# Patient Record
Sex: Female | Born: 1940 | Race: White | Hispanic: No | Marital: Married | Smoking: Former smoker
Health system: Southern US, Community
[De-identification: ages and names within clinical notes are randomized; demographics above are authoritative.]

## PROBLEM LIST (undated history)

## (undated) DIAGNOSIS — I7300 Raynaud's syndrome without gangrene: Secondary | ICD-10-CM

## (undated) DIAGNOSIS — F419 Anxiety disorder, unspecified: Secondary | ICD-10-CM

## (undated) DIAGNOSIS — M1990 Unspecified osteoarthritis, unspecified site: Secondary | ICD-10-CM

## (undated) DIAGNOSIS — R519 Headache, unspecified: Secondary | ICD-10-CM

## (undated) DIAGNOSIS — R51 Headache: Secondary | ICD-10-CM

## (undated) HISTORY — PX: FOOT SURGERY: SHX648

## (undated) HISTORY — PX: CHOLECYSTECTOMY: SHX55

## (undated) HISTORY — PX: APPENDECTOMY: SHX54

---

## 1997-07-21 ENCOUNTER — Ambulatory Visit (HOSPITAL_COMMUNITY): Admission: RE | Admit: 1997-07-21 | Discharge: 1997-07-21 | Payer: Self-pay | Admitting: Obstetrics and Gynecology

## 1998-05-22 ENCOUNTER — Other Ambulatory Visit: Admission: RE | Admit: 1998-05-22 | Discharge: 1998-05-22 | Payer: Self-pay | Admitting: Obstetrics and Gynecology

## 1999-06-21 ENCOUNTER — Other Ambulatory Visit: Admission: RE | Admit: 1999-06-21 | Discharge: 1999-06-21 | Payer: Self-pay | Admitting: Obstetrics and Gynecology

## 2003-10-06 ENCOUNTER — Other Ambulatory Visit: Admission: RE | Admit: 2003-10-06 | Discharge: 2003-10-06 | Payer: Self-pay | Admitting: Obstetrics and Gynecology

## 2004-10-10 ENCOUNTER — Other Ambulatory Visit: Admission: RE | Admit: 2004-10-10 | Discharge: 2004-10-10 | Payer: Self-pay | Admitting: Obstetrics and Gynecology

## 2004-12-09 ENCOUNTER — Emergency Department (HOSPITAL_COMMUNITY): Admission: EM | Admit: 2004-12-09 | Discharge: 2004-12-09 | Payer: Self-pay | Admitting: Family Medicine

## 2005-10-13 ENCOUNTER — Other Ambulatory Visit: Admission: RE | Admit: 2005-10-13 | Discharge: 2005-10-13 | Payer: Self-pay | Admitting: Obstetrics and Gynecology

## 2006-02-28 ENCOUNTER — Emergency Department (HOSPITAL_COMMUNITY): Admission: EM | Admit: 2006-02-28 | Discharge: 2006-02-28 | Payer: Self-pay | Admitting: Family Medicine
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
## 2007-07-12 ENCOUNTER — Other Ambulatory Visit: Admission: RE | Admit: 2007-07-12 | Discharge: 2007-07-12 | Payer: Self-pay | Admitting: Obstetrics and Gynecology

## 2009-07-24 ENCOUNTER — Other Ambulatory Visit: Admission: RE | Admit: 2009-07-24 | Discharge: 2009-07-24 | Payer: Self-pay | Admitting: Obstetrics and Gynecology

## 2012-12-23 ENCOUNTER — Other Ambulatory Visit: Payer: Self-pay | Admitting: Dermatology

## 2013-09-06 ENCOUNTER — Other Ambulatory Visit: Payer: Self-pay | Admitting: Dermatology

## 2014-01-13 ENCOUNTER — Other Ambulatory Visit: Payer: Self-pay | Admitting: Dermatology

## 2014-11-14 ENCOUNTER — Other Ambulatory Visit (HOSPITAL_COMMUNITY)
Admission: RE | Admit: 2014-11-14 | Discharge: 2014-11-14 | Disposition: A | Discharge status: Home / Self Care | Payer: Medicare Other | Source: Ambulatory Visit | Attending: Nurse Practitioner | Admitting: Nurse Practitioner

## 2014-11-14 ENCOUNTER — Other Ambulatory Visit: Payer: Self-pay | Admitting: Nurse Practitioner

## 2014-11-14 DIAGNOSIS — Z1151 Encounter for screening for human papillomavirus (HPV): Secondary | ICD-10-CM | POA: Insufficient documentation

## 2014-11-14 DIAGNOSIS — Z124 Encounter for screening for malignant neoplasm of cervix: Secondary | ICD-10-CM | POA: Insufficient documentation

## 2014-11-17 LAB — CYTOLOGY - PAP

## 2015-01-08 ENCOUNTER — Other Ambulatory Visit: Payer: Self-pay | Admitting: Obstetrics and Gynecology

## 2016-07-03 ENCOUNTER — Ambulatory Visit: Payer: Self-pay | Admitting: Orthopedic Surgery

## 2016-07-15 NOTE — Patient Instructions (Addendum)
Jodi Lopez  07/15/2016   Your procedure is scheduled on:  07/21/16  Report to [facility] Main  Entrance              Report to admitting at     0725   AM   Call this number if you have problems the morning of surgery [phone]   Remember: ONLY 1 PERSON MAY GO WITH YOU TO SHORT STAY TO GET  READY MORNING OF YOUR SURGERY.  Do not eat food or drink liquids :After Midnight.     Take these medicines the morning of surgery with A SIP OF WATER: NONE   Eye drops if needed                                You may not have any metal on your body including hair pins and              piercings  Do not wear jewelry, make-up, lotions, powders or perfumes, deodorant             Do not wear nail polish.  Do not shave  48 hours prior to surgery.             Do not bring valuables to the hospital. [facility] IS NOT             RESPONSIBLE   FOR VALUABLES.  Contacts, dentures or bridgework may not be worn into surgery.  Leave suitcase in the car. After surgery it may be brought to your room.                 Please read over the following fact sheets you were given: _____________________________________________________________________             [facility] - Preparing for Surgery Before surgery, you can play an important role.  Because skin is not sterile, your skin needs to be as free of germs as possible.  You can reduce the number of germs on your skin by washing with CHG (chlorahexidine gluconate) soap before surgery.  CHG is an antiseptic cleaner which kills germs and bonds with the skin to continue killing germs even after washing. Please DO NOT use if you have an allergy to CHG or antibacterial soaps.  If your skin becomes reddened/irritated stop using the CHG and inform your nurse when you arrive at Short Stay. Do not shave (including legs and underarms) for at least 48 hours prior to the first CHG shower.  You may shave your face/neck. Please follow  these instructions carefully:  1.  Shower with CHG Soap the night before surgery and the  morning of Surgery.  2.  If you choose to wash your hair, wash your hair first as usual with your  normal  shampoo.  3.  After you shampoo, rinse your hair and body thoroughly to remove the  shampoo.                           4.  Use CHG as you would any other liquid soap.  You can apply chg directly  to the skin and wash                       Gently with a scrungie or  clean washcloth.  5.  Apply the CHG Soap to your body ONLY FROM THE NECK DOWN.   Do not use on face/ open                           Wound or open sores. Avoid contact with eyes, ears mouth and genitals (private parts).                       Wash face,  Genitals (private parts) with your normal soap.             6.  Wash thoroughly, paying special attention to the area where your surgery  will be performed.  7.  Thoroughly rinse your body with warm water from the neck down.  8.  DO NOT shower/wash with your normal soap after using and rinsing off  the CHG Soap.                9.  Pat yourself dry with a clean towel.            10.  Wear clean pajamas.            11.  Place clean sheets on your bed the night of your first shower and do not  sleep with pets. Day of Surgery : Do not apply any lotions/deodorants the morning of surgery.  Please wear clean clothes to the hospital/surgery center.  FAILURE TO FOLLOW THESE INSTRUCTIONS MAY RESULT IN THE CANCELLATION OF YOUR SURGERY PATIENT SIGNATURE_________________________________  NURSE SIGNATURE__________________________________  ________________________________________________________________________  WHAT IS A BLOOD TRANSFUSION? Blood Transfusion Information  A transfusion is the replacement of blood or some of its parts. Blood is made up of multiple cells which provide different functions.  Red blood cells carry oxygen and are used for blood loss replacement.  White blood cells fight  against infection.  Platelets control bleeding.  Plasma helps clot blood.  Other blood products are available for specialized needs, such as hemophilia or other clotting disorders. BEFORE THE TRANSFUSION  Who gives blood for transfusions?   Healthy volunteers who are fully evaluated to make sure their blood is safe. This is blood bank blood. Transfusion therapy is the safest it has ever been in the practice of medicine. Before blood is taken from a donor, a complete history is taken to make sure that person has no history of diseases nor engages in risky social behavior (examples are intravenous drug use or sexual activity with multiple partners). The donor's travel history is screened to minimize risk of transmitting infections, such as malaria. The donated blood is tested for signs of infectious diseases, such as HIV and hepatitis. The blood is then tested to be sure it is compatible with you in order to minimize the chance of a transfusion reaction. If you or a relative donates blood, this is often done in anticipation of surgery and is not appropriate for emergency situations. It takes many days to process the donated blood. RISKS AND COMPLICATIONS Although transfusion therapy is very safe and saves many lives, the main dangers of transfusion include:   Getting an infectious disease.  Developing a transfusion reaction. This is an allergic reaction to something in the blood you were given. Every precaution is taken to prevent this. The decision to have a blood transfusion has been considered carefully by your caregiver before blood is given. Blood is not given unless the benefits outweigh the risks. AFTER THE TRANSFUSION  Right after receiving a blood transfusion, you will usually feel much better and more energetic. This is especially true if your red blood cells have gotten low (anemic). The transfusion raises the level of the red blood cells which carry oxygen, and this usually causes an  energy increase.  The nurse administering the transfusion will monitor you carefully for complications. HOME CARE INSTRUCTIONS  No special instructions are needed after a transfusion. You may find your energy is better. Speak with your caregiver about any limitations on activity for underlying diseases you may have. SEEK MEDICAL CARE IF:   Your condition is not improving after your transfusion.  You develop redness or irritation at the intravenous (IV) site. SEEK IMMEDIATE MEDICAL CARE IF:  Any of the following symptoms occur over the next 12 hours:  Shaking chills.  You have a temperature by mouth above 102 F (38.9 C), not controlled by medicine.  Chest, back, or muscle pain.  People around you feel you are not acting correctly or are confused.  Shortness of breath or difficulty breathing.  Dizziness and fainting.  You get a rash or develop hives.  You have a decrease in urine output.  Your urine turns a dark color or changes to pink, red, or brown. Any of the following symptoms occur over the next 10 days:  You have a temperature by mouth above 102 F (38.9 C), not controlled by medicine.  Shortness of breath.  Weakness after normal activity.  The white part of the eye turns yellow (jaundice).  You have a decrease in the amount of urine or are urinating less often.  Your urine turns a dark color or changes to pink, red, or brown. Document Released: 03/21/2000 Document Revised: 06/16/2011 Document Reviewed: 11/08/2007 Exit[employer] Patient Information 2014 [place], [place].  _______________________________________________________________________  Incentive Spirometer  An incentive spirometer is a tool that can help keep your lungs clear and active. This tool measures how well you are filling your lungs with each breath. Taking long deep breaths may help reverse or decrease the chance of developing breathing (pulmonary) problems (especially infection) following:  A  long period of time when you are unable to move or be active. BEFORE THE PROCEDURE   If the spirometer includes an indicator to show your best effort, your nurse or respiratory therapist will set it to a desired goal.  If possible, sit up straight or lean slightly forward. Try not to slouch.  Hold the incentive spirometer in an upright position. INSTRUCTIONS FOR USE  1. Sit on the edge of your bed if possible, or sit up as far as you can in bed or on a chair. 2. Hold the incentive spirometer in an upright position. 3. Breathe out normally. 4. Place the mouthpiece in your mouth and seal your lips tightly around it. 5. Breathe in slowly and as deeply as possible, raising the piston or the ball toward the top of the column. 6. Hold your breath for 3-5 seconds or for as long as possible. Allow the piston or ball to fall to the bottom of the column. 7. Remove the mouthpiece from your mouth and breathe out normally. 8. Rest for a few seconds and repeat Steps 1 through 7 at least 10 times every 1-2 hours when you are awake. Take your time and take a few normal breaths between deep breaths. 9. The spirometer may include an indicator to show your best effort. Use the indicator as a goal to work toward during each repetition. 10. After  each set of 10 deep breaths, practice coughing to be sure your lungs are clear. If you have an incision (the cut made at the time of surgery), support your incision when coughing by placing a pillow or rolled up towels firmly against it. Once you are able to get out of bed, walk around indoors and cough well. You may stop using the incentive spirometer when instructed by your caregiver.  RISKS AND COMPLICATIONS  Take your time so you do not get dizzy or light-headed.  If you are in pain, you may need to take or ask for pain medication before doing incentive spirometry. It is harder to take a deep breath if you are having pain. AFTER USE  Rest and breathe slowly and  easily.  It can be helpful to keep track of a log of your progress. Your caregiver can provide you with a simple table to help with this. If you are using the spirometer at home, follow these instructions: SEEK MEDICAL CARE IF:   You are having difficultly using the spirometer.  You have trouble using the spirometer as often as instructed.  Your pain medication is not giving enough relief while using the spirometer.  You develop fever of 100.5 F (38.1 C) or higher. SEEK IMMEDIATE MEDICAL CARE IF:   You cough up bloody sputum that had not been present before.  You develop fever of 102 F (38.9 C) or greater.  You develop worsening pain at or near the incision site. MAKE SURE YOU:   Understand these instructions.  Will watch your condition.  Will get help right away if you are not doing well or get worse. Document Released: 08/04/2006 Document Revised: 06/16/2011 Document Reviewed: 10/05/2006 [facility] Patient Information 2014 [place], [place].   ________________________________________________________________________

## 2016-07-15 NOTE — Progress Notes (Signed)
Spoke with [facility] to request surgery clearance ekg tracing and lov.

## 2016-07-16 ENCOUNTER — Encounter (HOSPITAL_COMMUNITY): Payer: Self-pay

## 2016-07-16 ENCOUNTER — Encounter (HOSPITAL_COMMUNITY)
Admission: RE | Admit: 2016-07-16 | Discharge: 2016-07-16 | Disposition: A | Discharge status: Home / Self Care | Payer: Medicare Other | Source: Ambulatory Visit | Attending: Orthopedic Surgery | Admitting: Orthopedic Surgery

## 2016-07-16 DIAGNOSIS — M1712 Unilateral primary osteoarthritis, left knee: Secondary | ICD-10-CM | POA: Diagnosis not present

## 2016-07-16 DIAGNOSIS — Z01818 Encounter for other preprocedural examination: Secondary | ICD-10-CM | POA: Insufficient documentation

## 2016-07-16 DIAGNOSIS — Z79899 Other long term (current) drug therapy: Secondary | ICD-10-CM | POA: Insufficient documentation

## 2016-07-16 HISTORY — DX: Raynaud's syndrome without gangrene: I73.00

## 2016-07-16 HISTORY — DX: Headache: R51

## 2016-07-16 HISTORY — DX: Unspecified osteoarthritis, unspecified site: M19.90

## 2016-07-16 HISTORY — DX: Headache, unspecified: R51.9

## 2016-07-16 HISTORY — DX: Anxiety disorder, unspecified: F41.9

## 2016-07-16 LAB — COMPREHENSIVE METABOLIC PANEL
ALBUMIN: 4.1 g/dL (ref 3.5–5.0)
ALT: 30 U/L (ref 14–54)
AST: 34 U/L (ref 15–41)
Alkaline Phosphatase: 50 U/L (ref 38–126)
Anion gap: 7 (ref 5–15)
BUN: 20 mg/dL (ref 6–20)
CO2: 28 mmol/L (ref 22–32)
Calcium: 9.6 mg/dL (ref 8.9–10.3)
Chloride: 105 mmol/L (ref 101–111)
Creatinine, Ser: 0.75 mg/dL (ref 0.44–1.00)
GFR calc Af Amer: >60 mL/min (ref >60)
GFR calc non Af Amer: >60 mL/min (ref >60)
GLUCOSE: 97 mg/dL (ref 65–99)
POTASSIUM: 5 mmol/L (ref 3.5–5.1)
SODIUM: 140 mmol/L (ref 135–145)
Total Bilirubin: 0.8 mg/dL (ref 0.3–1.2)
Total Protein: 6.6 g/dL (ref 6.5–8.1)

## 2016-07-16 LAB — CBC
HCT: 38.7 % (ref 36.0–46.0)
Hemoglobin: 12.7 g/dL (ref 12.0–15.0)
MCH: 31.3 pg (ref 26.0–34.0)
MCHC: 32.8 g/dL (ref 30.0–36.0)
MCV: 95.3 fL (ref 78.0–100.0)
Platelets: 185 10*3/uL (ref 150–400)
RBC: 4.06 MIL/uL (ref 3.87–5.11)
RDW: 12.7 % (ref 11.5–15.5)
WBC: 4.3 10*3/uL (ref 4.0–10.5)

## 2016-07-16 LAB — PROTIME-INR
INR: 0.98
Prothrombin Time: 13 seconds (ref 11.4–15.2)

## 2016-07-16 LAB — SURGICAL PCR SCREEN
MRSA, PCR: NEGATIVE
Staphylococcus aureus: NEGATIVE

## 2016-07-16 LAB — APTT: APTT: 28 s (ref 24–36)

## 2016-07-16 LAB — ABO/RH: ABO/RH(D): A POS

## 2016-07-18 ENCOUNTER — Ambulatory Visit: Payer: Self-pay | Admitting: Orthopedic Surgery

## 2016-07-18 ENCOUNTER — Other Ambulatory Visit: Payer: Self-pay | Admitting: Orthopedic Surgery

## 2016-07-18 NOTE — H&P (Signed)
Jodi Lopez DOB: 09/14/1940 Married / Language: English / Race: White Female Date of Admission:  07/21/2016 CC: Left knee pain History of Present Illness  The patient is a 76 year old female who comes in for a preoperative History and Physical. The patient is scheduled for a left total knee arthroplasty to be performed by Dr. Frank V. Aluisio, MD at [facility] on 07-21-2016. The patient is a 75 year old female who presented for follow up of their knee. The patient is being followed for their left knee pain and osteoarthritis. They are now over a year out from cortisone injection. Symptoms reported include: pain, aching, popping, grinding, instability and difficulty ambulating. The patient feels that they are doing poorly. The following medication has been used for pain control: Tylenol (prn). The patient has reported symptom improvement with: Cortisone injections but while they have not gotten any relief of their symptoms with viscosupplementation. She had a reaction to the Synvisc, but has not tried any other brands. She would like to consider total knee replacement at this time. She had a terrible reaction to the gel series in the past but has tolerated cortisone before. The knee has flared up again which she states is one of the worst that she has felt. The knee does not give way but does pop and click on her and cause pain. She has noticed that her knee wants to go inward and her leg is going out. It has already started to interfere with her daily activity and she wants to remain active. She had cortisone injection about a year ago, which helped for a few months. She is at a stage now where she is not only having pain, but also having a lot of functional difficulties with that knee. She is getting progressive deformity and is also getting unstable. She is at a stage now where she is concerned a lot more about the functional issues, but also concerned about the pain. She is ready to get  something done about the knee. She is ready to get it replaced at this time. They have been treated conservatively in the past for the above stated problem and despite conservative measures, they continue to have progressive pain and severe functional limitations and dysfunction. They have failed non-operative management including home exercise, medications, and injections. It is felt that they would benefit from undergoing total joint replacement. Risks and benefits of the procedure have been discussed with the patient and they elect to proceed with surgery. There are no active contraindications to surgery such as ongoing infection or rapidly progressive neurological disease.  Past Medical History Obesssive-Compulsive Order  Anxiety Disorder  Migraine Headache  Osteoarthritis  Skin Cancer  Primary osteoarthritis of left knee (M17.12)  Impaired Hearing  Impaired Vision  Varicose veins  Degenerative Disc Disease  Hypertension  Benign Essential Hyperlipidemia  Osteopenia  Cotton Wool Exudates  Iron Deficiency   Allergies  Synvisc *MUSCULOSKELETAL THERAPY AGENTS*  Latex  Questional Allergy Animal Dander  Adhesive Tape  Insect Bites   Family History Cancer  Father. Cerebrovascular Accident  Mother. Chronic Obstructive Lung Disease  Father. Congestive Heart Failure  Father. Depression  Sister. Heart Disease  Father. Osteoarthritis  Mother, Sister.  Social History Children  1 Current drinker  11/10/2013: Currently drinks beer and wine only occasionally per week Current work status  retired Exercise  Exercises weekly; does running / walking Living situation  live with spouse Marital status  married No history of drug/alcohol rehab  Not   under pain contract  Number of flights of stairs before winded  2-3 Tobacco / smoke exposure  11/10/2013: no Tobacco use  Former smoker. 11/10/2013: smoke(d) less than 1/2 pack(s) per day Advance  Directives  Living Will, Healthcare POA  Medication History Tylenol Extra Strength (  Tablet, Oral) Active. (as needed)  Past Surgical History Appendectomy  Date: 1963. Performed with Gallbladder Surgery Colon Polyp Removal - Colonoscopy  Dilation and Curettage of Uterus  Foot Surgery  bilateral Gallbladder Surgery  Date: 1963. open   Review of Systems  General Not Present- Chills, Fatigue, Fever, Memory Loss, Night Sweats, Weight Gain and Weight Loss. Skin Not Present- Eczema, Hives, Itching, Lesions and Rash. HEENT Not Present- Dentures, Double Vision, Headache, Hearing Loss, Tinnitus and Visual Loss. Respiratory Present- Shortness of breath with exertion. Not Present- Allergies, Chronic Cough, Coughing up blood and Shortness of breath at rest. Cardiovascular Present- Swelling. Not Present- Chest Pain, Difficulty Breathing Lying Down, Murmur, Palpitations and Racing/skipping heartbeats. Gastrointestinal Not Present- Abdominal Pain, Bloody Stool, Constipation, Diarrhea, Difficulty Swallowing, Heartburn, Jaundice, Loss of appetitie, Nausea and Vomiting. Female Genitourinary Not Present- Blood in Urine, Discharge, Flank Pain, Incontinence, Painful Urination, Urgency, Urinary frequency, Urinary Retention, Urinating at Night and Weak urinary stream. Musculoskeletal Present- Joint Pain, Joint Swelling, Morning Stiffness and Muscle Pain. Not Present- Back Pain, Muscle Weakness and Spasms. Neurological Not Present- Blackout spells, Difficulty with balance, Dizziness, Paralysis, Tremor and Weakness. Psychiatric Not Present- Insomnia.  Vitals  Weight: 130 lb Height: 67in Body Surface Area: 1.68 m Body Mass Index: 20.36 kg/m  Pulse: 68 (Regular)  BP: 128/76 (Sitting, Right Arm, Standard)    Physical Exam General Mental Status -Alert, cooperative and good historian. General Appearance-pleasant, Not in acute distress. Orientation-Oriented X3. Build &  Nutrition-Well nourished and Well developed.  Head and Neck Head-normocephalic, atraumatic . Neck Global Assessment - supple, no bruit auscultated on the right, no bruit auscultated on the left.  Eye Vision-Wears corrective lenses. Pupil - Bilateral-Regular and Round. Motion - Bilateral-EOMI.  Chest and Lung Exam Auscultation Breath sounds - clear at anterior chest wall and clear at posterior chest wall. Adventitious sounds - No Adventitious sounds.  Cardiovascular Auscultation Rhythm - Regular rate and rhythm. Heart Sounds - S1 WNL and S2 WNL. Murmurs & Other Heart Sounds - Auscultation of the heart reveals - No Murmurs.  Abdomen Palpation/Percussion Tenderness - Abdomen is non-tender to palpation. Rigidity (guarding) - Abdomen is soft. Auscultation Auscultation of the abdomen reveals - Bowel sounds normal.  Female Genitourinary Note: Not done, not pertinent to present illness   Musculoskeletal Note: On exam, she is in no distress. Her left knee shows no effusion. Her range is about 5 to 120 with marked crepitus on range of motion. Tender lateral greater than medial with no instability noted. Pulses, sensation, motor are intact. Right knee exam is unremarkable. Her left hip exam shows normal motion with no discomfort.  RADIOGRAPHS AP both knees and lateral show bone-on-bone arthritis in the medial patellofemoral compartments of the left knee.  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  PCP: Dr. Loretta Plume - Patient has been seen preoperatively and felt to be stable for surgery.  IV TXA  Anesthesia Issues: None  Patient was instructed on what medications to stop prior to surgery.  Signed electronically by Lauraine Rinne, III PA-C

## 2016-07-21 ENCOUNTER — Encounter (HOSPITAL_COMMUNITY)
Admission: RE | Disposition: A | Discharge status: Home / Self Care | Payer: Self-pay | Source: Ambulatory Visit | Attending: Orthopedic Surgery

## 2016-07-21 ENCOUNTER — Inpatient Hospital Stay (HOSPITAL_COMMUNITY): Payer: Medicare Other | Admitting: Registered Nurse

## 2016-07-21 ENCOUNTER — Encounter (HOSPITAL_COMMUNITY): Payer: Self-pay | Admitting: *Deleted

## 2016-07-21 ENCOUNTER — Inpatient Hospital Stay (HOSPITAL_COMMUNITY)
Admission: RE | Admit: 2016-07-21 | Discharge: 2016-07-23 | DRG: 470 | Disposition: A | Discharge status: Home / Self Care | Payer: Medicare Other | Source: Ambulatory Visit | Attending: Orthopedic Surgery | Admitting: Orthopedic Surgery

## 2016-07-21 DIAGNOSIS — F419 Anxiety disorder, unspecified: Secondary | ICD-10-CM | POA: Diagnosis present

## 2016-07-21 DIAGNOSIS — M1710 Unilateral primary osteoarthritis, unspecified knee: Secondary | ICD-10-CM | POA: Diagnosis present

## 2016-07-21 DIAGNOSIS — Z8261 Family history of arthritis: Secondary | ICD-10-CM | POA: Diagnosis not present

## 2016-07-21 DIAGNOSIS — Z888 Allergy status to other drugs, medicaments and biological substances status: Secondary | ICD-10-CM

## 2016-07-21 DIAGNOSIS — R110 Nausea: Secondary | ICD-10-CM | POA: Diagnosis not present

## 2016-07-21 DIAGNOSIS — E785 Hyperlipidemia, unspecified: Secondary | ICD-10-CM | POA: Diagnosis present

## 2016-07-21 DIAGNOSIS — H9190 Unspecified hearing loss, unspecified ear: Secondary | ICD-10-CM | POA: Diagnosis present

## 2016-07-21 DIAGNOSIS — M179 Osteoarthritis of knee, unspecified: Secondary | ICD-10-CM | POA: Diagnosis present

## 2016-07-21 DIAGNOSIS — Z85828 Personal history of other malignant neoplasm of skin: Secondary | ICD-10-CM

## 2016-07-21 DIAGNOSIS — I10 Essential (primary) hypertension: Secondary | ICD-10-CM | POA: Diagnosis present

## 2016-07-21 DIAGNOSIS — Z91040 Latex allergy status: Secondary | ICD-10-CM

## 2016-07-21 DIAGNOSIS — I7300 Raynaud's syndrome without gangrene: Secondary | ICD-10-CM | POA: Diagnosis present

## 2016-07-21 DIAGNOSIS — Z91048 Other nonmedicinal substance allergy status: Secondary | ICD-10-CM

## 2016-07-21 DIAGNOSIS — M170 Bilateral primary osteoarthritis of knee: Principal | ICD-10-CM | POA: Diagnosis present

## 2016-07-21 DIAGNOSIS — Z87891 Personal history of nicotine dependence: Secondary | ICD-10-CM | POA: Diagnosis not present

## 2016-07-21 DIAGNOSIS — H547 Unspecified visual loss: Secondary | ICD-10-CM | POA: Diagnosis present

## 2016-07-21 DIAGNOSIS — M1712 Unilateral primary osteoarthritis, left knee: Secondary | ICD-10-CM

## 2016-07-21 HISTORY — PX: TOTAL KNEE ARTHROPLASTY: SHX125

## 2016-07-21 LAB — TYPE AND SCREEN
ABO/RH(D): A POS
Antibody Screen: NEGATIVE

## 2016-07-21 SURGERY — ARTHROPLASTY, KNEE, TOTAL
Anesthesia: Spinal | Site: Knee | Laterality: Left

## 2016-07-21 MED ORDER — LIP MEDEX EX OINT
TOPICAL_OINTMENT | CUTANEOUS | Status: AC
  Administered 2016-07-21: 1
  Filled 2016-07-21: qty 7

## 2016-07-21 MED ORDER — DEXTROSE 5 % IV SOLN
500.0000 mg | Freq: Four times a day (QID) | INTRAVENOUS | Status: DC | PRN
  Filled 2016-07-21: qty 5

## 2016-07-21 MED ORDER — EPHEDRINE 5 MG/ML INJ
INTRAVENOUS | Status: AC
  Filled 2016-07-21: qty 10

## 2016-07-21 MED ORDER — DOCUSATE SODIUM 100 MG PO CAPS
100.0000 mg | ORAL_CAPSULE | Freq: Two times a day (BID) | ORAL | Status: DC
  Administered 2016-07-21 – 2016-07-23 (×4): 100 mg via ORAL
  Filled 2016-07-21 (×4): qty 1

## 2016-07-21 MED ORDER — DEXAMETHASONE SODIUM PHOSPHATE 10 MG/ML IJ SOLN
10.0000 mg | Freq: Once | INTRAMUSCULAR | Status: AC
  Administered 2016-07-22: 10 mg via INTRAVENOUS
  Filled 2016-07-21: qty 1

## 2016-07-21 MED ORDER — ACETAMINOPHEN 500 MG PO TABS
1000.0000 mg | ORAL_TABLET | Freq: Four times a day (QID) | ORAL | Status: AC
  Administered 2016-07-21 – 2016-07-22 (×4): 1000 mg via ORAL
  Filled 2016-07-21 (×4): qty 2

## 2016-07-21 MED ORDER — TRAMADOL HCL 50 MG PO TABS
50.0000 mg | ORAL_TABLET | Freq: Four times a day (QID) | ORAL | Status: DC | PRN
  Administered 2016-07-21 (×2): 100 mg via ORAL
  Filled 2016-07-21 (×3): qty 2

## 2016-07-21 MED ORDER — DEXAMETHASONE SODIUM PHOSPHATE 10 MG/ML IJ SOLN
INTRAMUSCULAR | Status: AC
  Filled 2016-07-21: qty 1

## 2016-07-21 MED ORDER — LACTATED RINGERS IV SOLN
INTRAVENOUS | Status: DC
  Administered 2016-07-21: 08:00:00 via INTRAVENOUS

## 2016-07-21 MED ORDER — SODIUM CHLORIDE 0.9 % IV SOLN
1000.0000 mg | Freq: Once | INTRAVENOUS | Status: AC
  Administered 2016-07-21: 1000 mg via INTRAVENOUS
  Filled 2016-07-21: qty 1100

## 2016-07-21 MED ORDER — BUPIVACAINE HCL (PF) 0.5 % IJ SOLN
INTRAMUSCULAR | Status: AC
  Filled 2016-07-21: qty 30

## 2016-07-21 MED ORDER — ACETAMINOPHEN 10 MG/ML IV SOLN
INTRAVENOUS | Status: AC
  Filled 2016-07-21: qty 100

## 2016-07-21 MED ORDER — RIVAROXABAN 10 MG PO TABS
10.0000 mg | ORAL_TABLET | Freq: Every day | ORAL | Status: DC
  Administered 2016-07-22 – 2016-07-23 (×2): 10 mg via ORAL
  Filled 2016-07-21 (×2): qty 1

## 2016-07-21 MED ORDER — ACETAMINOPHEN 325 MG PO TABS: 650.0000 mg | ORAL_TABLET | Freq: Four times a day (QID) | ORAL | Status: DC | PRN

## 2016-07-21 MED ORDER — DIPHENHYDRAMINE HCL 12.5 MG/5ML PO ELIX: 12.5000 mg | ORAL_SOLUTION | ORAL | Status: DC | PRN

## 2016-07-21 MED ORDER — CEFAZOLIN SODIUM-DEXTROSE 2-4 GM/100ML-% IV SOLN
2.0000 g | Freq: Four times a day (QID) | INTRAVENOUS | Status: AC
  Administered 2016-07-21 (×2): 2 g via INTRAVENOUS
  Filled 2016-07-21 (×2): qty 100

## 2016-07-21 MED ORDER — MIDAZOLAM HCL 2 MG/2ML IJ SOLN
INTRAMUSCULAR | Status: AC
  Filled 2016-07-21: qty 2

## 2016-07-21 MED ORDER — SODIUM CHLORIDE 0.9 % IJ SOLN
INTRAMUSCULAR | Status: AC
  Filled 2016-07-21: qty 50

## 2016-07-21 MED ORDER — LIDOCAINE 2% (20 MG/ML) 5 ML SYRINGE
INTRAMUSCULAR | Status: DC | PRN
  Administered 2016-07-21: 100 mg via INTRAVENOUS

## 2016-07-21 MED ORDER — HYDROMORPHONE HCL 2 MG/ML IJ SOLN: 0.2500 mg | INTRAMUSCULAR | Status: DC | PRN

## 2016-07-21 MED ORDER — ONDANSETRON HCL 4 MG/2ML IJ SOLN
4.0000 mg | Freq: Four times a day (QID) | INTRAMUSCULAR | Status: DC | PRN
  Administered 2016-07-21 – 2016-07-22 (×2): 4 mg via INTRAVENOUS
  Filled 2016-07-21 (×2): qty 2

## 2016-07-21 MED ORDER — PROMETHAZINE HCL 25 MG/ML IJ SOLN
INTRAMUSCULAR | Status: AC
  Filled 2016-07-21: qty 1

## 2016-07-21 MED ORDER — BUPIVACAINE LIPOSOME 1.3 % IJ SUSP
20.0000 mL | Freq: Once | INTRAMUSCULAR | Status: DC
  Filled 2016-07-21: qty 20

## 2016-07-21 MED ORDER — MENTHOL 3 MG MT LOZG: 1.0000 | LOZENGE | OROMUCOSAL | Status: DC | PRN

## 2016-07-21 MED ORDER — FLEET ENEMA 7-19 GM/118ML RE ENEM: 1.0000 | ENEMA | Freq: Once | RECTAL | Status: DC | PRN

## 2016-07-21 MED ORDER — TRANEXAMIC ACID 1000 MG/10ML IV SOLN
1000.0000 mg | INTRAVENOUS | Status: AC
  Administered 2016-07-21: 1000 mg via INTRAVENOUS
  Filled 2016-07-21: qty 1100

## 2016-07-21 MED ORDER — GABAPENTIN 300 MG PO CAPS
300.0000 mg | ORAL_CAPSULE | Freq: Once | ORAL | Status: AC
  Administered 2016-07-21: 300 mg via ORAL

## 2016-07-21 MED ORDER — MEPERIDINE HCL 50 MG/ML IJ SOLN: 6.2500 mg | INTRAMUSCULAR | Status: DC | PRN

## 2016-07-21 MED ORDER — SODIUM CHLORIDE 0.9 % IR SOLN
Status: DC | PRN
  Administered 2016-07-21: 1000 mL

## 2016-07-21 MED ORDER — GABAPENTIN 300 MG PO CAPS
ORAL_CAPSULE | ORAL | Status: AC
  Administered 2016-07-21: 300 mg via ORAL
  Filled 2016-07-21: qty 1

## 2016-07-21 MED ORDER — LIDOCAINE 2% (20 MG/ML) 5 ML SYRINGE
INTRAMUSCULAR | Status: AC
  Filled 2016-07-21: qty 5

## 2016-07-21 MED ORDER — ONDANSETRON HCL 4 MG/2ML IJ SOLN
INTRAMUSCULAR | Status: DC | PRN
  Administered 2016-07-21: 4 mg via INTRAVENOUS

## 2016-07-21 MED ORDER — PROPOFOL 10 MG/ML IV BOLUS
INTRAVENOUS | Status: AC
  Filled 2016-07-21: qty 40

## 2016-07-21 MED ORDER — OXYCODONE HCL 5 MG PO TABS
5.0000 mg | ORAL_TABLET | ORAL | Status: DC | PRN
Start: 2016-07-21 — End: 2016-07-23
  Administered 2016-07-22 (×2): 10 mg via ORAL
  Administered 2016-07-23: 5 mg via ORAL
  Filled 2016-07-21: qty 2
  Filled 2016-07-21 (×4): qty 1

## 2016-07-21 MED ORDER — EPHEDRINE SULFATE-NACL 50-0.9 MG/10ML-% IV SOSY
PREFILLED_SYRINGE | INTRAVENOUS | Status: DC | PRN
  Administered 2016-07-21 (×3): 5 mg via INTRAVENOUS

## 2016-07-21 MED ORDER — SODIUM CHLORIDE 0.9 % IJ SOLN
INTRAMUSCULAR | Status: AC
  Filled 2016-07-21: qty 10

## 2016-07-21 MED ORDER — METOCLOPRAMIDE HCL 5 MG/ML IJ SOLN: 5.0000 mg | Freq: Three times a day (TID) | INTRAMUSCULAR | Status: DC | PRN

## 2016-07-21 MED ORDER — DEXAMETHASONE SODIUM PHOSPHATE 10 MG/ML IJ SOLN
10.0000 mg | Freq: Once | INTRAMUSCULAR | Status: AC
  Administered 2016-07-21: 10 mg via INTRAVENOUS

## 2016-07-21 MED ORDER — SODIUM CHLORIDE 0.9 % IJ SOLN
INTRAMUSCULAR | Status: DC | PRN
  Administered 2016-07-21: 30 mL

## 2016-07-21 MED ORDER — CEFAZOLIN SODIUM-DEXTROSE 2-4 GM/100ML-% IV SOLN
2.0000 g | INTRAVENOUS | Status: AC
  Administered 2016-07-21: 2 g via INTRAVENOUS

## 2016-07-21 MED ORDER — MORPHINE SULFATE (PF) 2 MG/ML IV SOLN: 1.0000 mg | INTRAVENOUS | Status: DC | PRN

## 2016-07-21 MED ORDER — CHLORHEXIDINE GLUCONATE 4 % EX LIQD: 60.0000 mL | Freq: Once | CUTANEOUS | Status: DC

## 2016-07-21 MED ORDER — PHENOL 1.4 % MT LIQD
1.0000 | OROMUCOSAL | Status: DC | PRN
Start: 2016-07-21 — End: 2016-07-23
  Filled 2016-07-21: qty 177

## 2016-07-21 MED ORDER — ACETAMINOPHEN 10 MG/ML IV SOLN
1000.0000 mg | Freq: Once | INTRAVENOUS | Status: AC
  Administered 2016-07-21: 1000 mg via INTRAVENOUS

## 2016-07-21 MED ORDER — CEFAZOLIN SODIUM-DEXTROSE 2-4 GM/100ML-% IV SOLN
INTRAVENOUS | Status: AC
  Filled 2016-07-21: qty 100

## 2016-07-21 MED ORDER — BUPIVACAINE LIPOSOME 1.3 % IJ SUSP
INTRAMUSCULAR | Status: DC | PRN
  Administered 2016-07-21: 20 mL

## 2016-07-21 MED ORDER — POLYETHYLENE GLYCOL 3350 17 G PO PACK: 17.0000 g | PACK | Freq: Every day | ORAL | Status: DC | PRN

## 2016-07-21 MED ORDER — SODIUM CHLORIDE 0.9 % IV SOLN
INTRAVENOUS | Status: DC
  Administered 2016-07-21 – 2016-07-22 (×2): via INTRAVENOUS

## 2016-07-21 MED ORDER — FENTANYL CITRATE (PF) 100 MCG/2ML IJ SOLN
INTRAMUSCULAR | Status: AC
  Filled 2016-07-21: qty 2

## 2016-07-21 MED ORDER — FENTANYL CITRATE (PF) 100 MCG/2ML IJ SOLN
INTRAMUSCULAR | Status: DC | PRN
  Administered 2016-07-21 (×2): 25 ug via INTRAVENOUS
  Administered 2016-07-21: 50 ug via INTRAVENOUS

## 2016-07-21 MED ORDER — METOCLOPRAMIDE HCL 5 MG PO TABS: 5.0000 mg | ORAL_TABLET | Freq: Three times a day (TID) | ORAL | Status: DC | PRN

## 2016-07-21 MED ORDER — PROMETHAZINE HCL 25 MG/ML IJ SOLN
6.2500 mg | INTRAMUSCULAR | Status: DC | PRN
  Administered 2016-07-21: 6.25 mg via INTRAVENOUS

## 2016-07-21 MED ORDER — ONDANSETRON HCL 4 MG PO TABS: 4.0000 mg | ORAL_TABLET | Freq: Four times a day (QID) | ORAL | Status: DC | PRN

## 2016-07-21 MED ORDER — MIDAZOLAM HCL 5 MG/5ML IJ SOLN
INTRAMUSCULAR | Status: DC | PRN
  Administered 2016-07-21: 2 mg via INTRAVENOUS

## 2016-07-21 MED ORDER — METHOCARBAMOL 500 MG PO TABS
500.0000 mg | ORAL_TABLET | Freq: Four times a day (QID) | ORAL | Status: DC | PRN
  Administered 2016-07-22 – 2016-07-23 (×3): 500 mg via ORAL
  Filled 2016-07-21 (×3): qty 1

## 2016-07-21 MED ORDER — LACTATED RINGERS IV SOLN
INTRAVENOUS | Status: DC
  Administered 2016-07-21: 11:00:00 via INTRAVENOUS

## 2016-07-21 MED ORDER — BUPIVACAINE HCL (PF) 0.5 % IJ SOLN
INTRAMUSCULAR | Status: DC | PRN
  Administered 2016-07-21: 3 mL

## 2016-07-21 MED ORDER — ONDANSETRON HCL 4 MG/2ML IJ SOLN
INTRAMUSCULAR | Status: AC
  Filled 2016-07-21: qty 2

## 2016-07-21 MED ORDER — PROPOFOL 500 MG/50ML IV EMUL
INTRAVENOUS | Status: DC | PRN
  Administered 2016-07-21: 40 ug/kg/min via INTRAVENOUS

## 2016-07-21 MED ORDER — ACETAMINOPHEN 650 MG RE SUPP: 650.0000 mg | Freq: Four times a day (QID) | RECTAL | Status: DC | PRN

## 2016-07-21 MED ORDER — BISACODYL 10 MG RE SUPP: 10.0000 mg | Freq: Every day | RECTAL | Status: DC | PRN

## 2016-07-21 SURGICAL SUPPLY — 53 items
BAG DECANTER FOR FLEXI CONT (MISCELLANEOUS) ×3 IMPLANT
BAG SPEC THK2 15X12 ZIP CLS (MISCELLANEOUS) ×1
BAG ZIPLOCK 12X15 (MISCELLANEOUS) ×3 IMPLANT
BANDAGE ACE 6X5 VEL STRL LF (GAUZE/BANDAGES/DRESSINGS) ×3 IMPLANT
BANDAGE ELASTIC 6 VELCRO ST LF (GAUZE/BANDAGES/DRESSINGS) ×2 IMPLANT
BLADE SAG 18X100X1.27 (BLADE) ×3 IMPLANT
BLADE SAW SGTL 11.0X1.19X90.0M (BLADE) ×3 IMPLANT
BOWL SMART MIX CTS (DISPOSABLE) ×3 IMPLANT
CAPT KNEE TOTAL 3 ATTUNE ×2 IMPLANT
CEMENT HV SMART SET (Cement) ×6 IMPLANT
CLOSURE WOUND 1/2 X4 (GAUZE/BANDAGES/DRESSINGS) ×1
COVER SURGICAL LIGHT HANDLE (MISCELLANEOUS) ×3 IMPLANT
CUFF TOURN SGL QUICK 34 (TOURNIQUET CUFF) ×3
CUFF TRNQT CYL 34X4X40X1 (TOURNIQUET CUFF) ×1 IMPLANT
DECANTER SPIKE VIAL GLASS SM (MISCELLANEOUS) ×3 IMPLANT
DRAPE U-SHAPE 47X51 STRL (DRAPES) ×3 IMPLANT
DRSG ADAPTIC 3X8 NADH LF (GAUZE/BANDAGES/DRESSINGS) ×3 IMPLANT
DRSG PAD ABDOMINAL 8X10 ST (GAUZE/BANDAGES/DRESSINGS) ×3 IMPLANT
DURAPREP 26ML APPLICATOR (WOUND CARE) ×3 IMPLANT
ELECT REM PT RETURN 15FT ADLT (MISCELLANEOUS) ×3 IMPLANT
EVACUATOR 1/8 PVC DRAIN (DRAIN) ×3 IMPLANT
GAUZE SPONGE 4X4 12PLY STRL (GAUZE/BANDAGES/DRESSINGS) ×3 IMPLANT
GLOVE BIO SURGEON STRL SZ7.5 (GLOVE) IMPLANT
GLOVE BIO SURGEON STRL SZ8 (GLOVE) ×1 IMPLANT
GLOVE BIOGEL PI IND STRL 6.5 (GLOVE) IMPLANT
GLOVE BIOGEL PI IND STRL 8 (GLOVE) ×1 IMPLANT
GLOVE BIOGEL PI INDICATOR 6.5 (GLOVE) ×6
GLOVE BIOGEL PI INDICATOR 8 (GLOVE) ×2
GLOVE SURG SS PI 6.5 STRL IVOR (GLOVE) IMPLANT
GOWN STRL REUS W/TWL LRG LVL3 (GOWN DISPOSABLE) ×3 IMPLANT
GOWN STRL REUS W/TWL XL LVL3 (GOWN DISPOSABLE) IMPLANT
HANDPIECE INTERPULSE COAX TIP (DISPOSABLE) ×3
IMMOBILIZER KNEE 20 (SOFTGOODS) ×3
IMMOBILIZER KNEE 20 THIGH 36 (SOFTGOODS) ×1 IMPLANT
MANIFOLD NEPTUNE II (INSTRUMENTS) ×3 IMPLANT
NS IRRIG 1000ML POUR BTL (IV SOLUTION) ×3 IMPLANT
PACK TOTAL KNEE CUSTOM (KITS) ×3 IMPLANT
PAD ABD 8X10 STRL (GAUZE/BANDAGES/DRESSINGS) ×2 IMPLANT
PADDING CAST COTTON 6X4 STRL (CAST SUPPLIES) ×5 IMPLANT
POSITIONER SURGICAL ARM (MISCELLANEOUS) ×3 IMPLANT
SET HNDPC FAN SPRY TIP SCT (DISPOSABLE) ×1 IMPLANT
STRIP CLOSURE SKIN 1/2X4 (GAUZE/BANDAGES/DRESSINGS) ×3 IMPLANT
SUT MNCRL AB 4-0 PS2 18 (SUTURE) ×3 IMPLANT
SUT STRATAFIX 0 PDS 27 VIOLET (SUTURE) ×3
SUT VIC AB 2-0 CT1 27 (SUTURE) ×9
SUT VIC AB 2-0 CT1 TAPERPNT 27 (SUTURE) ×3 IMPLANT
SUTURE STRATFX 0 PDS 27 VIOLET (SUTURE) ×1 IMPLANT
SYR 50ML LL SCALE MARK (SYRINGE) IMPLANT
TRAY FOLEY BAG SILVER LF 16FR (SET/KITS/TRAYS/PACK) ×2 IMPLANT
TRAY FOLEY W/METER SILVER 16FR (SET/KITS/TRAYS/PACK) ×3 IMPLANT
WATER STERILE IRR 1000ML POUR (IV SOLUTION) ×6 IMPLANT
WRAP KNEE MAXI GEL POST OP (GAUZE/BANDAGES/DRESSINGS) ×3 IMPLANT
YANKAUER SUCT BULB TIP 10FT TU (MISCELLANEOUS) ×3 IMPLANT

## 2016-07-21 NOTE — Anesthesia Preprocedure Evaluation (Addendum)
Anesthesia Evaluation  Patient identified by MRN, date of birth, ID band Patient awake    Reviewed: Allergy & Precautions, NPO status , Patient's Chart, lab work & pertinent test results  Airway Mallampati: II  TM Distance: >3 FB Neck ROM: Full    Dental  (+) Teeth Intact, Dental Advisory Given   Pulmonary former smoker,    breath sounds clear to auscultation       Cardiovascular + Peripheral Vascular Disease   Rhythm:Regular Rate:Normal     Neuro/Psych  Headaches, Anxiety    GI/Hepatic negative GI ROS, Neg liver ROS,   Endo/Other  negative endocrine ROS  Renal/GU negative Renal ROS  negative genitourinary   Musculoskeletal  (+) Arthritis , Osteoarthritis,    Abdominal   Peds negative pediatric ROS (+)  Hematology negative hematology ROS (+)   Anesthesia Other Findings   Reproductive/Obstetrics negative OB ROS                            Lab Results  Component Value Date   WBC 4.3 07/16/2016   HGB 12.7 07/16/2016   HCT 38.7 07/16/2016   MCV 95.3 07/16/2016   PLT 185 07/16/2016     Anesthesia Physical Anesthesia Plan  ASA: II  Anesthesia Plan: Spinal   Post-op Pain Management:  Regional for Post-op pain   Induction: Intravenous  Airway Management Planned:   Additional Equipment:   Intra-op Plan:   Post-operative Plan:   Informed Consent: I have reviewed the patients History and Physical, chart, labs and discussed the procedure including the risks, benefits and alternatives for the proposed anesthesia with the patient or authorized representative who has indicated his/her understanding and acceptance.     Plan Discussed with: CRNA  Anesthesia Plan Comments:         Anesthesia Quick Evaluation

## 2016-07-21 NOTE — Anesthesia Procedure Notes (Signed)
Anesthesia Regional Block: Adductor canal block   Pre-Anesthetic Checklist: ,, timeout performed, Correct Patient, Correct Site, Correct Laterality, Correct Procedure, Correct Position, site marked, Risks and benefits discussed,  Surgical consent,  Pre-op evaluation,  At surgeon's request and post-op pain management  Laterality: Left  Prep: chloraprep       Needles:  Injection technique: Single-shot  Needle Type: Echogenic Needle     Needle Length: 9cm  Needle Gauge: 21     Additional Needles:   Procedures: ultrasound guided,,,,,,,,  Narrative:  Start time: 07/21/2016 9:10 AM End time: 07/21/2016 9:15 AM Injection made incrementally with aspirations every 5 mL.  Performed by: Personally  Anesthesiologist: Shona Simpson D  Additional Notes: Tolerated well.

## 2016-07-21 NOTE — Interval H&P Note (Signed)
History and Physical Interval Note:  07/21/2016 8:22 AM  Jodi Lopez  has presented today for surgery, with the diagnosis of LEFT KNEE OA  The various methods of treatment have been discussed with the patient and family. After consideration of risks, benefits and other options for treatment, the patient has consented to  Procedure(s): LEFT TOTAL KNEE ARTHROPLASTY (Left) as a surgical intervention .  The patient's history has been reviewed, patient examined, no change in status, stable for surgery.  I have reviewed the patient's chart and labs.  Questions were answered to the patient's satisfaction.     Loanne Drilling

## 2016-07-21 NOTE — Progress Notes (Signed)
Assisteddr. Hollis{ with left, ultrasound guided, adductor canal block. Side rails up, monitors on throughout procedure. See vital signs in flow sheet. Tolerated Procedure well.

## 2016-07-21 NOTE — Anesthesia Procedure Notes (Signed)
Spinal  End time: 07/21/2016 9:29 AM Staffing Resident/CRNA: Carleene Cooper A Preanesthetic Checklist Completed: patient identified, site marked, surgical consent, pre-op evaluation, timeout performed, IV checked, risks and benefits discussed and monitors and equipment checked Spinal Block Patient position: sitting Prep: ChloraPrep and site prepped and draped Patient monitoring: heart rate, continuous pulse ox and blood pressure Approach: midline Location: L2-3 Injection technique: single-shot Needle Needle type: Pencan  Needle gauge: 24 G Needle length: 10 cm Assessment Sensory level: T4 Additional Notes Spinal kit checked and verified. Pt tolerated well. One attempt by CRNA. + CSF, - heme. Pt placed supine.

## 2016-07-21 NOTE — Anesthesia Postprocedure Evaluation (Addendum)
Anesthesia Post Note  Patient: Jodi Lopez  Procedure(s) Performed: Procedure(s) (LRB): LEFT TOTAL KNEE ARTHROPLASTY (Left)  Patient location during evaluation: PACU Anesthesia Type: Spinal Level of consciousness: oriented and awake and alert Pain management: pain level controlled Vital Signs Assessment: post-procedure vital signs reviewed and stable Respiratory status: spontaneous breathing, respiratory function stable and patient connected to nasal cannula oxygen Cardiovascular status: blood pressure returned to baseline and stable Postop Assessment: no headache and no backache Anesthetic complications: no       Last Vitals:  Vitals:   07/21/16 1421 07/21/16 1520  BP: 127/60 (!) 126/52  Pulse: 70 72  Resp: 16 17  Temp: 36.4 C 36.1 C    Last Pain:  Vitals:   07/21/16 1520  TempSrc: Axillary  PainSc:   No pain.                Shelton Silvas

## 2016-07-21 NOTE — Op Note (Signed)
OPERATIVE REPORT-TOTAL KNEE ARTHROPLASTY   Pre-operative diagnosis- Osteoarthritis  Left knee(s)  Post-operative diagnosis- Osteoarthritis Left knee(s)  Procedure-  Left  Total Knee Arthroplasty (Depuy Attune)  Surgeon- Gus Rankin. Giabella Duhart, MD  Assistant- Dimitri Ped, PA-C   Anesthesia-  Adductor canal block and spinal  EBL-* No blood loss amount entered *   Drains Hemovac  Tourniquet time-  Total Tourniquet Time Documented: Thigh (Left) - 29 minutes Total: Thigh (Left) - 29 minutes     Complications- None  Condition-PACU - hemodynamically stable.   Brief Clinical Note  Jodi Lopez is a 76 y.o. year old female with end stage OA of her left knee with progressively worsening pain and dysfunction. She has constant pain, with activity and at rest and significant functional deficits with difficulties even with ADLs. She has had extensive non-op management including analgesics, injections of cortisone and viscosupplements, and home exercise program, but remains in significant pain with significant dysfunction. Radiographs show bone on bone arthritis lateral and patelloefemoral. She presents now for left Total Knee Arthroplasty.    Procedure in detail---   The patient is brought into the operating room and positioned supine on the operating table. After successful administration of  Adductor canal block and spinal,   a tourniquet is placed high on the  Left thigh(s) and the lower extremity is prepped and draped in the usual sterile fashion. Time out is performed by the operating team and then the  Left lower extremity is wrapped in Esmarch, knee flexed and the tourniquet inflated to 300 mmHg.       A midline incision is made with a ten blade through the subcutaneous tissue to the level of the extensor mechanism. A fresh blade is used to make a medial parapatellar arthrotomy. Soft tissue over the proximal medial tibia is subperiosteally elevated to the joint line with a knife and  into the semimembranosus bursa with a Cobb elevator. Soft tissue over the proximal lateral tibia is elevated with attention being paid to avoiding the patellar tendon on the tibial tubercle. The patella is everted, knee flexed 90 degrees and the ACL and PCL are removed. Findings are bone on bone lateral and patellofemoral.        The drill is used to create a starting hole in the distal femur and the canal is thoroughly irrigated with sterile saline to remove the fatty contents. The 5 degree Left  valgus alignment guide is placed into the femoral canal and the distal femoral cutting block is pinned to remove 9 mm off the distal femur. Resection is made with an oscillating saw.      The tibia is subluxed forward and the menisci are removed. The extramedullary alignment guide is placed referencing proximally at the medial aspect of the tibial tubercle and distally along the second metatarsal axis and tibial crest. The block is pinned to remove 2 mm off the more deficient lateral  side. Resection is made with an oscillating saw. Size 5is the most appropriate size for the tibia and the proximal tibia is prepared with the modular drill and keel punch for that size.      The femoral sizing guide is placed and size 5 is most appropriate. Rotation is marked off the epicondylar axis and confirmed by creating a rectangular flexion gap at 90 degrees. The size 5 cutting block is pinned in this rotation and the anterior, posterior and chamfer cuts are made with the oscillating saw. The intercondylar block is then placed and that  cut is made.      Trial size 5 tibial component, trial size 5 posterior stabilized femur and a 6  mm posterior stabilized rotating platform insert trial is placed. Full extension is achieved with excellent varus/valgus and anterior/posterior balance throughout full range of motion. The patella is everted and thickness measured to be 21  mm. Free hand resection is taken to 12 mm, a 35 template is  placed, lug holes are drilled, trial patella is placed, and it tracks normally. Osteophytes are removed off the posterior femur with the trial in place. All trials are removed and the cut bone surfaces prepared with pulsatile lavage. Cement is mixed and once ready for implantation, the size 5 tibial implant, size  5 posterior stabilized femoral component, and the size 35 patella are cemented in place and the patella is held with the clamp. The trial insert is placed and the knee held in full extension. The Exparel (20 ml mixed with 30 ml saline) is injected into the extensor mechanism, posterior capsule, medial and lateral gutters and subcutaneous tissues.  All extruded cement is removed and once the cement is hard the permanent 6 mm posterior stabilized rotating platform insert is placed into the tibial tray.      The wound is copiously irrigated with saline solution and the extensor mechanism closed over a hemovac drain with #1 V-loc suture. The tourniquet is released for a total tourniquet time of 29  minutes. Flexion against gravity is 140 degrees and the patella tracks normally. Subcutaneous tissue is closed with 2.0 vicryl and subcuticular with running 4.0 Monocryl. The incision is cleaned and dried and steri-strips and a bulky sterile dressing are applied. The limb is placed into a knee immobilizer and the patient is awakened and transported to recovery in stable condition.      Please note that a surgical assistant was a medical necessity for this procedure in order to perform it in a safe and expeditious manner. Surgical assistant was necessary to retract the ligaments and vital neurovascular structures to prevent injury to them and also necessary for proper positioning of the limb to allow for anatomic placement of the prosthesis.   Gus Rankin Jackquelyn Sundberg, MD    07/21/2016, 10:18 AM

## 2016-07-21 NOTE — H&P (View-Only) (Signed)
Jodi Lopez DOB: 14-May-1940 Married / Language: English / Race: White Female Date of Admission:  07/21/2016 CC: Left knee pain History of Present Illness  The patient is a 76 year old female who comes in for a preoperative History and Physical. The patient is scheduled for a left total knee arthroplasty to be performed by Dr. Gus Rankin. Aluisio, MD at [facility] on 07-21-2016. The patient is a 76 year old female who presented for follow up of their knee. The patient is being followed for their left knee pain and osteoarthritis. They are now over a year out from cortisone injection. Symptoms reported include: pain, aching, popping, grinding, instability and difficulty ambulating. The patient feels that they are doing poorly. The following medication has been used for pain control: Tylenol (prn). The patient has reported symptom improvement with: Cortisone injections but while they have not gotten any relief of their symptoms with viscosupplementation. She had a reaction to the Synvisc, but has not tried any other brands. She would like to consider total knee replacement at this time. She had a terrible reaction to the gel series in the past but has tolerated cortisone before. The knee has flared up again which she states is one of the worst that she has felt. The knee does not give way but does pop and click on her and cause pain. She has noticed that her knee wants to go inward and her leg is going out. It has already started to interfere with her daily activity and she wants to remain active. She had cortisone injection about a year ago, which helped for a few months. She is at a stage now where she is not only having pain, but also having a lot of functional difficulties with that knee. She is getting progressive deformity and is also getting unstable. She is at a stage now where she is concerned a lot more about the functional issues, but also concerned about the pain. She is ready to get  something done about the knee. She is ready to get it replaced at this time. They have been treated conservatively in the past for the above stated problem and despite conservative measures, they continue to have progressive pain and severe functional limitations and dysfunction. They have failed non-operative management including home exercise, medications, and injections. It is felt that they would benefit from undergoing total joint replacement. Risks and benefits of the procedure have been discussed with the patient and they elect to proceed with surgery. There are no active contraindications to surgery such as ongoing infection or rapidly progressive neurological disease.  Past Medical History Obesssive-Compulsive Order  Anxiety Disorder  Migraine Headache  Osteoarthritis  Skin Cancer  Primary osteoarthritis of left knee (M17.12)  Impaired Hearing  Impaired Vision  Varicose veins  Degenerative Disc Disease  Hypertension  Benign Essential Hyperlipidemia  Osteopenia  Cotton Wool Exudates  Iron Deficiency   Allergies  Synvisc *MUSCULOSKELETAL THERAPY AGENTS*  Latex  Questional Allergy Animal Dander  Adhesive Tape  Insect Bites   Family History Cancer  Father. Cerebrovascular Accident  Mother. Chronic Obstructive Lung Disease  Father. Congestive Heart Failure  Father. Depression  Sister. Heart Disease  Father. Osteoarthritis  Mother, Sister.  Social History Children  1 Current drinker  11/10/2013: Currently drinks beer and wine only occasionally per week Current work status  retired Scientist, physiological weekly; does running / walking Living situation  live with spouse Marital status  married No history of drug/alcohol rehab  Not  under pain contract  Number of flights of stairs before winded  2-3 Tobacco / smoke exposure  11/10/2013: no Tobacco use  Former smoker. 11/10/2013: smoke(d) less than 1/2 pack(s) per day Advance  Directives  Living Will, Healthcare POA  Medication History Tylenol Extra Strength (  Tablet, Oral) Active. (as needed)  Past Surgical History Appendectomy  Date: 1963. Performed with Gallbladder Surgery Colon Polyp Removal - Colonoscopy  Dilation and Curettage of Uterus  Foot Surgery  bilateral Gallbladder Surgery  Date: 1963. open   Review of Systems  General Not Present- Chills, Fatigue, Fever, Memory Loss, Night Sweats, Weight Gain and Weight Loss. Skin Not Present- Eczema, Hives, Itching, Lesions and Rash. HEENT Not Present- Dentures, Double Vision, Headache, Hearing Loss, Tinnitus and Visual Loss. Respiratory Present- Shortness of breath with exertion. Not Present- Allergies, Chronic Cough, Coughing up blood and Shortness of breath at rest. Cardiovascular Present- Swelling. Not Present- Chest Pain, Difficulty Breathing Lying Down, Murmur, Palpitations and Racing/skipping heartbeats. Gastrointestinal Not Present- Abdominal Pain, Bloody Stool, Constipation, Diarrhea, Difficulty Swallowing, Heartburn, Jaundice, Loss of appetitie, Nausea and Vomiting. Female Genitourinary Not Present- Blood in Urine, Discharge, Flank Pain, Incontinence, Painful Urination, Urgency, Urinary frequency, Urinary Retention, Urinating at Night and Weak urinary stream. Musculoskeletal Present- Joint Pain, Joint Swelling, Morning Stiffness and Muscle Pain. Not Present- Back Pain, Muscle Weakness and Spasms. Neurological Not Present- Blackout spells, Difficulty with balance, Dizziness, Paralysis, Tremor and Weakness. Psychiatric Not Present- Insomnia.  Vitals  Weight: 130 lb Height: 67in Body Surface Area: 1.68 m Body Mass Index: 20.36 kg/m  Pulse: 68 (Regular)  BP: 128/76 (Sitting, Right Arm, Standard)    Physical Exam General Mental Status -Alert, cooperative and good historian. General Appearance-pleasant, Not in acute distress. Orientation-Oriented X3. Build &  Nutrition-Well nourished and Well developed.  Head and Neck Head-normocephalic, atraumatic . Neck Global Assessment - supple, no bruit auscultated on the right, no bruit auscultated on the left.  Eye Vision-Wears corrective lenses. Pupil - Bilateral-Regular and Round. Motion - Bilateral-EOMI.  Chest and Lung Exam Auscultation Breath sounds - clear at anterior chest wall and clear at posterior chest wall. Adventitious sounds - No Adventitious sounds.  Cardiovascular Auscultation Rhythm - Regular rate and rhythm. Heart Sounds - S1 WNL and S2 WNL. Murmurs & Other Heart Sounds - Auscultation of the heart reveals - No Murmurs.  Abdomen Palpation/Percussion Tenderness - Abdomen is non-tender to palpation. Rigidity (guarding) - Abdomen is soft. Auscultation Auscultation of the abdomen reveals - Bowel sounds normal.  Female Genitourinary Note: Not done, not pertinent to present illness   Musculoskeletal Note: On exam, she is in no distress. Her left knee shows no effusion. Her range is about 5 to 120 with marked crepitus on range of motion. Tender lateral greater than medial with no instability noted. Pulses, sensation, motor are intact. Right knee exam is unremarkable. Her left hip exam shows normal motion with no discomfort.  RADIOGRAPHS AP both knees and lateral show bone-on-bone arthritis in the medial patellofemoral compartments of the left knee.  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  PCP: Dr. Loretta Plume - Patient has been seen preoperatively and felt to be stable for surgery.  IV TXA  Anesthesia Issues: None  Patient was instructed on what medications to stop prior to surgery.  Signed electronically by Lauraine Rinne, III PA-C

## 2016-07-21 NOTE — Progress Notes (Signed)
PT Cancellation Note  Patient Details Name: Jodi Lopez MRN: [id number] DOB: 10-Jun-1940   Cancelled Treatment:    Reason Eval/Treat Not Completed: Other (comment) Pt states her numbness has improved however not fully worn off, and she would like to wait to start PT tomorrow.   Nawaf Strange,KATHrine E 07/21/2016, 4:32 PM Zenovia Jarred, PT, DPT 07/21/2016 Pager: [phone]

## 2016-07-21 NOTE — Transfer of Care (Signed)
Immediate Anesthesia Transfer of Care Note  Patient: Jodi Lopez  Procedure(s) Performed: Procedure(s): LEFT TOTAL KNEE ARTHROPLASTY (Left)  Patient Location: PACU  Anesthesia Type:MAC and Spinal  Level of Consciousness: awake, alert , oriented and patient cooperative  Airway & Oxygen Therapy: Patient Spontanous Breathing and Patient connected to face mask oxygen  Post-op Assessment: Report given to RN and Post -op Vital signs reviewed and stable  Post vital signs: Reviewed and stable  Last Vitals:  Vitals:   07/21/16 0738  BP: 137/62  Pulse: 64  Resp: 16  Temp: 36.6 C    Last Pain:  Vitals:   07/21/16 0803  TempSrc:   PainSc: 3       Patients Stated Pain Goal: 3 (79/39/[id number])  Complications: No apparent anesthesia complications

## 2016-07-22 ENCOUNTER — Encounter (HOSPITAL_COMMUNITY): Payer: Self-pay | Admitting: Orthopedic Surgery

## 2016-07-22 LAB — CBC
HEMATOCRIT: 32.6 % — AB (ref 36.0–46.0)
Hemoglobin: 10.9 g/dL — ABNORMAL LOW (ref 12.0–15.0)
MCH: 31.9 pg (ref 26.0–34.0)
MCHC: 33.4 g/dL (ref 30.0–36.0)
MCV: 95.3 fL (ref 78.0–100.0)
Platelets: 142 10*3/uL — ABNORMAL LOW (ref 150–400)
RBC: 3.42 MIL/uL — ABNORMAL LOW (ref 3.87–5.11)
RDW: 12.9 % (ref 11.5–15.5)
WBC: 8.3 10*3/uL (ref 4.0–10.5)

## 2016-07-22 LAB — BASIC METABOLIC PANEL
ANION GAP: 6 (ref 5–15)
BUN: 15 mg/dL (ref 6–20)
CALCIUM: 8.5 mg/dL — AB (ref 8.9–10.3)
CO2: 25 mmol/L (ref 22–32)
CREATININE: 0.66 mg/dL (ref 0.44–1.00)
Chloride: 105 mmol/L (ref 101–111)
GFR calc Af Amer: >60 mL/min (ref >60)
GFR calc non Af Amer: >60 mL/min (ref >60)
GLUCOSE: 98 mg/dL (ref 65–99)
Potassium: 4.2 mmol/L (ref 3.5–5.1)
Sodium: 136 mmol/L (ref 135–145)

## 2016-07-22 MED ORDER — RIVAROXABAN 10 MG PO TABS: 10.0000 mg | ORAL_TABLET | Freq: Every day | ORAL | 0 refills | Status: AC

## 2016-07-22 MED ORDER — METHOCARBAMOL 500 MG PO TABS: 500.0000 mg | ORAL_TABLET | Freq: Four times a day (QID) | ORAL | 0 refills | Status: AC | PRN

## 2016-07-22 MED ORDER — OXYCODONE HCL 5 MG PO TABS: 5.0000 mg | ORAL_TABLET | ORAL | 0 refills | Status: AC | PRN

## 2016-07-22 MED ORDER — TRAMADOL HCL 50 MG PO TABS: 50.0000 mg | ORAL_TABLET | Freq: Four times a day (QID) | ORAL | 0 refills | Status: AC | PRN

## 2016-07-22 NOTE — Progress Notes (Addendum)
Disposition plan is for home with Husband, Start outpatient at [facility] on Friday April 20th. [phone]

## 2016-07-22 NOTE — Discharge Summary (Signed)
Physician Discharge Summary   Patient ID: Jodi Lopez MRN: [id number] DOB/AGE: 12-07-1940 76 y.o.  Admit date: 07/21/2016 Discharge date: 07/23/2016  Primary Diagnosis:  Osteoarthritis  Left knee(s)  Admission Diagnoses:  Past Medical History:  Diagnosis Date  . Anxiety    no treatment  . Arthritis    Rheaumatoid markers and general  . Headache    rare migraines  . Raynaud's disease    mild   Discharge Diagnoses:   Principal Problem:   OA (osteoarthritis) of knee  Estimated body mass index is 20.36 kg/m as calculated from the following:   Height as of this encounter: _0  (1.702 m).   Weight as of this encounter: 59 kg (130 lb).  Procedure:  Procedure(s) (LRB): LEFT TOTAL KNEE ARTHROPLASTY (Left)   Consults: None  HPI: Jodi Lopez is a 76 y.o. year old female with end stage OA of her left knee with progressively worsening pain and dysfunction. She has constant pain, with activity and at rest and significant functional deficits with difficulties even with ADLs. She has had extensive non-op management including analgesics, injections of cortisone and viscosupplements, and home exercise program, but remains in significant pain with significant dysfunction. Radiographs show bone on bone arthritis lateral and patelloefemoral. She presents now for left Total Knee Arthroplasty.  Laboratory Data: Admission on 07/21/2016  Component Date Value Ref Range Status  . WBC 07/22/2016 8.3  4.0 - 10.5 K/uL Final  . RBC 07/22/2016 3.42* 3.87 - 5.11 MIL/uL Final  . Hemoglobin 07/22/2016 10.9* 12.0 - 15.0 g/dL Final  . HCT 07/22/2016 32.6* 36.0 - 46.0 % Final  . MCV 07/22/2016 95.3  78.0 - 100.0 fL Final  . MCH 07/22/2016 31.9  26.0 - 34.0 pg Final  . MCHC 07/22/2016 33.4  30.0 - 36.0 g/dL Final  . RDW 07/22/2016 12.9  11.5 - 15.5 % Final  . Platelets 07/22/2016 142* 150 - 400 K/uL Final  . Sodium 07/22/2016 136  135 - 145 mmol/L Final  . Potassium 07/22/2016 4.2  3.5 - 5.1  mmol/L Final  . Chloride 07/22/2016 105  101 - 111 mmol/L Final  . CO2 07/22/2016 25  22 - 32 mmol/L Final  . Glucose, Bld 07/22/2016 98  65 - 99 mg/dL Final  . BUN 07/22/2016 15  6 - 20 mg/dL Final  . Creatinine, Ser 07/22/2016 0.66  0.44 - 1.00 mg/dL Final  . Calcium 07/22/2016 8.5* 8.9 - 10.3 mg/dL Final  . GFR calc non Af Amer 07/22/2016 >60  >60 mL/min Final  . GFR calc Af Amer 07/22/2016 >60  >60 mL/min Final   Comment: (NOTE) The eGFR has been calculated using the CKD EPI equation. This calculation has not been validated in all clinical situations. eGFR's persistently <60 mL/min signify possible Chronic Kidney Disease.   Georgiann Hahn gap 07/22/2016 6  5 - 15 Final  Hospital Outpatient Visit on 07/16/2016  Component Date Value Ref Range Status  . aPTT 07/16/2016 28  24 - 36 seconds Final  . WBC 07/16/2016 4.3  4.0 - 10.5 K/uL Final  . RBC 07/16/2016 4.06  3.87 - 5.11 MIL/uL Final  . Hemoglobin 07/16/2016 12.7  12.0 - 15.0 g/dL Final  . HCT 07/16/2016 38.7  36.0 - 46.0 % Final  . MCV 07/16/2016 95.3  78.0 - 100.0 fL Final  . MCH 07/16/2016 31.3  26.0 - 34.0 pg Final  . MCHC 07/16/2016 32.8  30.0 - 36.0 g/dL Final  . RDW 07/16/2016 12.7  11.5 -  15.5 % Final  . Platelets 07/16/2016 185  150 - 400 K/uL Final  . Sodium 07/16/2016 140  135 - 145 mmol/L Final  . Potassium 07/16/2016 5.0  3.5 - 5.1 mmol/L Final  . Chloride 07/16/2016 105  101 - 111 mmol/L Final  . CO2 07/16/2016 28  22 - 32 mmol/L Final  . Glucose, Bld 07/16/2016 97  65 - 99 mg/dL Final  . BUN 07/16/2016 20  6 - 20 mg/dL Final  . Creatinine, Ser 07/16/2016 0.75  0.44 - 1.00 mg/dL Final  . Calcium 07/16/2016 9.6  8.9 - 10.3 mg/dL Final  . Total Protein 07/16/2016 6.6  6.5 - 8.1 g/dL Final  . Albumin 07/16/2016 4.1  3.5 - 5.0 g/dL Final  . AST 07/16/2016 34  15 - 41 U/L Final  . ALT 07/16/2016 30  14 - 54 U/L Final  . Alkaline Phosphatase 07/16/2016 50  38 - 126 U/L Final  . Total Bilirubin 07/16/2016 0.8  0.3 - 1.2  mg/dL Final  . GFR calc non Af Amer 07/16/2016 >60  >60 mL/min Final  . GFR calc Af Amer 07/16/2016 >60  >60 mL/min Final   Comment: (NOTE) The eGFR has been calculated using the CKD EPI equation. This calculation has not been validated in all clinical situations. eGFR's persistently <60 mL/min signify possible Chronic Kidney Disease.   . Anion gap 07/16/2016 7  5 - 15 Final  . Prothrombin Time 07/16/2016 13.0  11.4 - 15.2 seconds Final  . INR 07/16/2016 0.98   Final  . ABO/RH(D) 07/16/2016 A POS   Final  . Antibody Screen 07/16/2016 NEG   Final  . Sample Expiration 07/16/2016 07/24/2016   Final  . Extend sample reason 07/16/2016 NO TRANSFUSIONS OR PREGNANCY IN THE PAST 3 MONTHS   Final  . MRSA, PCR 07/16/2016 NEGATIVE  NEGATIVE Final  . Staphylococcus aureus 07/16/2016 NEGATIVE  NEGATIVE Final   Comment:        The Xpert SA Assay (FDA approved for NASAL specimens in patients over 64 years of age), is one component of a comprehensive surveillance program.  Test performance has been validated by [facility] for patients greater than or equal to 59 year old. It is not intended to diagnose infection nor to guide or monitor treatment.   . ABO/RH(D) 07/16/2016 A POS   Final     X-Rays:No results found.  EKG:No orders found for this or any previous visit.   Hospital Course: Jodi Lopez is a 76 y.o. who was admitted to [facility]. They were brought to the operating room on 07/21/2016 and underwent Procedure(s): LEFT TOTAL KNEE ARTHROPLASTY.  Patient tolerated the procedure well and was later transferred to the recovery room and then to the orthopaedic floor for postoperative care.  They were given PO and IV analgesics for pain control following their surgery.  They were given 24 hours of postoperative antibiotics of  Anti-infectives    Start     Dose/Rate Route Frequency Ordered Stop   07/21/16 1530  ceFAZolin (ANCEF) IVPB 2g/100 mL premix     2 g 200 mL/hr over 30  Minutes Intravenous Every 6 hours 07/21/16 1421 07/21/16 2228   07/21/16 0758  ceFAZolin (ANCEF) 2-4 GM/100ML-% IVPB    Comments:  Harvell, Gwendolyn  : cabinet override      07/21/16 0758 07/21/16 0915   07/21/16 0735  ceFAZolin (ANCEF) IVPB 2g/100 mL premix     2 g 200 mL/hr over 30 Minutes Intravenous  On call to O.R. 07/21/16 7169 07/21/16 0915     and started on DVT prophylaxis in the form of Xarelto.   PT and OT were ordered for total joint protocol.  Discharge planning consulted to help with postop disposition and equipment needs.  Patient had a tough night on the evening of surgery.  Had some nausea earlier. Soft pressures eariler that day also and  treated with fluids.They started to get up OOB with therapy on day one. Hemovac drain was pulled without difficulty.  Continued to work with therapy into day two.  Dressing was changed on day two and the incision was healing well.   Patient was seen in rounds on day two, worked with therapy twice and was ready to go home.   Diet: Regular diet Activity:WBAT Follow-up:in 2 weeks Disposition - Home Discharged Condition: good   Discharge Instructions    Call MD / Call 911    Complete by:  As directed    If you experience chest pain or shortness of breath, CALL 911 and be transported to the hospital emergency room.  If you develope a fever above 101 F, pus (white drainage) or increased drainage or redness at the wound, or calf pain, call your surgeon's office.   Change dressing    Complete by:  As directed    Change dressing daily with sterile 4 x 4 inch gauze dressing and apply TED hose. Do not submerge the incision under water.   Constipation Prevention    Complete by:  As directed    Drink plenty of fluids.  Prune juice may be helpful.  You may use a stool softener, such as Colace (over the counter) 100 mg twice a day.  Use MiraLax (over the counter) for constipation as needed.   Diet - low sodium heart healthy    Complete by:  As  directed    Discharge instructions    Complete by:  As directed    Pick up stool softner and laxative for home use following surgery while on pain medications. Do not submerge incision under water. Please use good hand washing techniques while changing dressing each day. May shower starting three days after surgery. Please use a clean towel to pat the incision dry following showers. Continue to use ice for pain and swelling after surgery. Do not use any lotions or creams on the incision until instructed by your surgeon.  Wear both TED hose on both legs during the day every day for three weeks, but may have off at night at home.  Postoperative Constipation Protocol  Constipation - defined medically as fewer than three stools per week and severe constipation as less than one stool per week.  One of the most common issues patients have following surgery is constipation.  Even if you have a regular bowel pattern at home, your normal regimen is likely to be disrupted due to multiple reasons following surgery.  Combination of anesthesia, postoperative narcotics, change in appetite and fluid intake all can affect your bowels.  In order to avoid complications following surgery, here are some recommendations in order to help you during your recovery period.  Colace (docusate) - Pick up an over-the-counter form of Colace or another stool softener and take twice a day as long as you are requiring postoperative pain medications.  Take with a full glass of water daily.  If you experience loose stools or diarrhea, hold the colace until you stool forms back up.  If your symptoms do not  get better within 1 week or if they get worse, check with your doctor.  Dulcolax (bisacodyl) - Pick up over-the-counter and take as directed by the product packaging as needed to assist with the movement of your bowels.  Take with a full glass of water.  Use this product as needed if not relieved by Colace only.   MiraLax  (polyethylene glycol) - Pick up over-the-counter to have on hand.  MiraLax is a solution that will increase the amount of water in your bowels to assist with bowel movements.  Take as directed and can mix with a glass of water, juice, soda, coffee, or tea.  Take if you go more than two days without a movement. Do not use MiraLax more than once per day. Call your doctor if you are still constipated or irregular after using this medication for 7 days in a row.  If you continue to have problems with postoperative constipation, please contact the office for further assistance and recommendations.  If you experience "the worst abdominal pain ever" or develop nausea or vomiting, please contact the office immediatly for further recommendations for treatment.   Take Xarelto for two and a half more weeks, then discontinue Xarelto. Once the patient has completed the blood thinner regimen, then take a Baby 81 mg Aspirin daily for three more weeks.   Do not put a pillow under the knee. Place it under the heel.    Complete by:  As directed    Do not sit on low chairs, stoools or toilet seats, as it may be difficult to get up from low surfaces    Complete by:  As directed    Driving restrictions    Complete by:  As directed    No driving until released by the physician.   Increase activity slowly as tolerated    Complete by:  As directed    Lifting restrictions    Complete by:  As directed    No lifting until released by the physician.   Patient may shower    Complete by:  As directed    You may shower without a dressing once there is no drainage.  Do not wash over the wound.  If drainage remains, do not shower until drainage stops.   TED hose    Complete by:  As directed    Use stockings (TED hose) for 3 weeks on both leg(s).  You may remove them at night for sleeping.   Weight bearing as tolerated    Complete by:  As directed    Laterality:  left   Extremity:  Lower     Allergies as of 07/22/2016       Reactions   Synvisc [hylan G-f 20] Swelling   Latex Other (See Comments)   Redness/swelling   Iodine Other (See Comments)   Applied topically & covered with dressing--caused blistering?      Medication List    TAKE these medications   acetaminophen 325 MG tablet Commonly known as:  TYLENOL Take 325 mg by mouth every 6 (six) hours as needed (for pain/headache.).   LUBRIDERM EX Apply 1 application topically 3 (three) times daily as needed (for moisturizing after bathing and as needed).   methocarbamol 500 MG tablet Commonly known as:  ROBAXIN Take 1 tablet (500 mg total) by mouth every 6 (six) hours as needed for muscle spasms.   oxyCODONE 5 MG immediate release tablet Commonly known as:  Oxy IR/ROXICODONE Take 1-2 tablets (5-10 mg total)  by mouth every 4 (four) hours as needed for moderate pain or severe pain.   rivaroxaban 10 MG Tabs tablet Commonly known as:  XARELTO Take 1 tablet (10 mg total) by mouth daily with breakfast. Take Xarelto for two and a half more weeks following discharge from the hospital, then discontinue Xarelto. Once the patient has completed the blood thinner regimen, then take a Baby 81 mg Aspirin daily for three more weeks. Start taking on:  07/23/2016   SYSTANE 0.4-0.3 % Soln Generic drug:  Polyethyl Glycol-Propyl Glycol Place 1 drop into both eyes 3 (three) times daily as needed (for itchy/dry eyes).   traMADol 50 MG tablet Commonly known as:  ULTRAM Take 1-2 tablets (50-100 mg total) by mouth every 6 (six) hours as needed for moderate pain.            Durable Medical Equipment        Start     Ordered   07/22/16 1104  For home use only DME 3 n 1  Once     07/22/16 1104     Follow-up Information    Gearlean Alf, MD. Schedule an appointment as soon as possible for a visit on 08/05/2016.   Specialty:  Orthopedic Surgery Contact information: [address] [place] [address] [phone]            Signed: Arlee Muslim, PA-C Orthopaedic Surgery 07/22/2016, 9:45 PM

## 2016-07-22 NOTE — Evaluation (Signed)
`Occupational Therapy Evaluation Patient Details Name: Jodi Lopez MRN: [id number] DOB: 12-Jul-1940 Today's Date: 07/22/2016    History of Present Illness s/p L TKA, h/o anxiety and obsessive/compulsive   Clinical Impression   This 76 year old female was admitted for the above. She will benefit from continued OT to increase safety and independence with adls. Goals in acute are for supervision level. She was independent before sx and currently needs min A overall    Follow Up Recommendations  No OT follow up;Supervision/Assistance - 24 hour    Equipment Recommendations  3 in 1 bedside commode    Recommendations for Other Services       Precautions / Restrictions Precautions Precautions: Knee;Fall Restrictions Weight Bearing Restrictions: No      Mobility Bed Mobility Overal bed mobility: Needs Assistance Bed Mobility: Sit to Supine       Sit to supine: Min assist   General bed mobility comments: assist for LLE  Transfers Overall transfer level: Needs assistance Equipment used: Rolling walker (2 wheeled) Transfers: Sit to/from Stand Sit to Stand: Min assist         General transfer comment: light steadying assistance from bed; min A from toilet. Cues for UE/LE placement    Balance                                           ADL either performed or assessed with clinical judgement   ADL Overall ADL's : Needs assistance/impaired Eating/Feeding: Independent   Grooming: Wash/dry hands;Supervision/safety;Standing   Upper Body Bathing: Set up;Sitting   Lower Body Bathing: Minimal assistance;Sit to/from stand   Upper Body Dressing : Set up;Sitting   Lower Body Dressing: Minimal assistance;Sit to/from stand   Toilet Transfer: Minimal assistance;Ambulation;Comfort height toilet   Toileting- Clothing Manipulation and Hygiene: Supervision/safety;Sitting/lateral lean         General ADL Comments: ambulated to bathroom. Pt had  difficulty getting up from comfort height commode with one grab bar, which she doesn't have. She wants 3:1 for home. She is staying at her old house which has a tub. Educated on tub readiness, washing at sink initially and tub bench. Pt plans to wash at sink initially     Vision         Perception     Praxis      Pertinent Vitals/Pain Pain Assessment: 0-10 Pain Score: 4  Pain Location: L knee Pain Descriptors / Indicators: Sore Pain Intervention(s): Limited activity within patient's tolerance;Monitored during session;Premedicated before session;Repositioned     Hand Dominance     Extremity/Trunk Assessment Upper Extremity Assessment Upper Extremity Assessment: Overall WFL for tasks assessed           Communication Communication Communication: No difficulties   Cognition Arousal/Alertness: Awake/alert Behavior During Therapy: WFL for tasks assessed/performed Overall Cognitive Status: Within Functional Limits for tasks assessed                                     General Comments       Exercises     Shoulder Instructions      Home Living Family/patient expects to be discharged to:: Private residence Living Arrangements: Spouse/significant other                 Bathroom Shower/Tub: Engineer, civil (consulting)  Toilet: Handicapped height     Home Equipment: None          Prior Functioning/Environment Level of Independence: Independent                 OT Problem List: Decreased strength;Decreased activity tolerance;Decreased knowledge of use of DME or AE;Pain      OT Treatment/Interventions: Self-care/ADL training;DME and/or AE instruction;Patient/family education    OT Goals(Current goals can be found in the care plan section) Acute Rehab OT Goals Patient Stated Goal: home OT Goal Formulation: With patient Time For Goal Achievement: 07/29/16 Potential to Achieve Goals: Good ADL Goals Pt Will Transfer to Toilet: with  supervision;ambulating;bedside commode Additional ADL Goal #1: pt will perform bed mobility at supervision level in preparation for adls/toilet transfers  OT Frequency: Min 2X/week   Barriers to D/C:            Co-evaluation              End of Session CPM Left Knee CPM Left Knee: Off  Activity Tolerance: Patient tolerated treatment well Patient left: in chair;with call bell/phone within reach;with chair alarm set  OT Visit Diagnosis: Pain Pain - Right/Left: Left Pain - part of body: Knee                Time: 1610-9604 OT Time Calculation (min): 20 min Charges:  OT General Charges $OT Visit: 1 Procedure OT Evaluation $OT Eval Low Complexity: 1 Procedure G-Codes:     [place], OTR/L [phone] 07/22/2016  [name] 07/22/2016, 10:07 AM

## 2016-07-22 NOTE — Progress Notes (Signed)
Physical Therapy Treatment Patient Details Name: Jodi Lopez MRN: [id number] DOB: [birth year] Today's Date: 07/22/2016    History of Present Illness Pt is a 76 year old female s/p L TKA, h/o anxiety and obsessive/compulsive    PT Comments    Pt performed LE exercises and ambulated in hallway again.  Pt then assisted back to bed.    Follow Up Recommendations  Outpatient PT     Equipment Recommendations  None recommended by PT    Recommendations for Other Services       Precautions / Restrictions Precautions Precautions: Knee;Fall Restrictions Other Position/Activity Restrictions: WBAT    Mobility  Bed Mobility Overal bed mobility: Needs Assistance Bed Mobility: Sit to Supine       Sit to supine: Min assist   General bed mobility comments: assist for L LE  Transfers Overall transfer level: Needs assistance Equipment used: Rolling walker (2 wheeled) Transfers: Sit to/from Stand Sit to Stand: Min guard         General transfer comment: verbal cues for UE and LE positioning  Ambulation/Gait Ambulation/Gait assistance: Min guard Ambulation Distance (Feet): 60 Feet Assistive device: Rolling walker (2 wheeled) Gait Pattern/deviations: Step-to pattern;Decreased stance time - left;Antalgic     General Gait Details: verbal cues for sequence, RW positioning, heel strike, posture, step length   Stairs            Wheelchair Mobility    Modified Rankin (Stroke Patients Only)       Balance                                            Cognition Arousal/Alertness: Awake/alert Behavior During Therapy: WFL for tasks assessed/performed Overall Cognitive Status: Within Functional Limits for tasks assessed                                        Exercises Total Joint Exercises Ankle Circles/Pumps: AROM;Both;10 reps Quad Sets: AROM;10 reps;Left Heel Slides: AAROM;Left;10 reps Hip ABduction/ADduction: AROM;Left;10  reps Straight Leg Raises: AROM;Left;10 reps Goniometric ROM: approx 40* L knee AAROM flexion during heel slides    General Comments        Pertinent Vitals/Pain Pain Assessment: 0-10 Pain Score: 2  Pain Location: L knee Pain Descriptors / Indicators: Sore;Aching Pain Intervention(s): Limited activity within patient's tolerance;Monitored during session;Repositioned;Premedicated before session    Home Living Family/patient expects to be discharged to:: Private residence Living Arrangements: Spouse/significant other   Type of Home: House Home Access: Stairs to enter Entrance Stairs-Rails: Right Home Layout: One level Home Equipment: Environmental consultant - 2 wheels      Prior Function Level of Independence: Independent          PT Goals (current goals can now be found in the care plan section) Acute Rehab PT Goals PT Goal Formulation: With patient Time For Goal Achievement: 07/26/16 Potential to Achieve Goals: Good Progress towards PT goals: Progressing toward goals    Frequency    7X/week      PT Plan Current plan remains appropriate    Co-evaluation             End of Session Equipment Utilized During Treatment: Gait belt Activity Tolerance: Patient tolerated treatment well Patient left: in bed;with call bell/phone within reach;with family/visitor present   PT Visit  Diagnosis: Other abnormalities of gait and mobility (R26.89)     Time: 1610-9604 PT Time Calculation (min) (ACUTE ONLY): 28 min  Charges:  $Gait Training: 8-22 mins $Therapeutic Exercise: 8-22 mins                    G Codes:      Zenovia Jarred, PT, DPT 07/22/2016 Pager: [phone]    Maida Sale E 07/22/2016, 3:27 PM

## 2016-07-22 NOTE — Evaluation (Signed)
Physical Therapy Evaluation Patient Details Name: Jodi Lopez MRN: [id number] DOB: 08/07/1940 Today's Date: 07/22/2016   History of Present Illness  Pt is a 76 year old female s/p L TKA, h/o anxiety and obsessive/compulsive  Clinical Impression  Pt is s/p TKA resulting in the deficits listed below (see PT Problem List).  Pt will benefit from skilled PT to increase their independence and safety with mobility to allow discharge to the venue listed below.  Pt mobilizing well POD #1 and plans to return home with spouse and start with outpatient PT.     Follow Up Recommendations Outpatient PT    Equipment Recommendations  None recommended by PT    Recommendations for Other Services       Precautions / Restrictions Precautions Precautions: Knee;Fall Restrictions Weight Bearing Restrictions: No Other Position/Activity Restrictions: WBAT      Mobility  Bed Mobility Overal bed mobility: Needs Assistance Bed Mobility: Sit to Supine       Sit to supine: Min assist   General bed mobility comments: pt up in recliner on arrival  Transfers Overall transfer level: Needs assistance Equipment used: Rolling walker (2 wheeled) Transfers: Sit to/from Stand Sit to Stand: Min guard         General transfer comment: verbal cues for UE and LE positioning  Ambulation/Gait Ambulation/Gait assistance: Min guard Ambulation Distance (Feet): 80 Feet Assistive device: Rolling walker (2 wheeled) Gait Pattern/deviations: Step-to pattern;Decreased stance time - left;Antalgic     General Gait Details: verbal cues for sequence, RW positioning, heel strike, posture, step length  Stairs            Wheelchair Mobility    Modified Rankin (Stroke Patients Only)       Balance                                             Pertinent Vitals/Pain Pain Assessment: 0-10 Pain Score: 3  Pain Location: L knee Pain Descriptors / Indicators: Sore;Aching Pain  Intervention(s): Limited activity within patient's tolerance;Repositioned;Monitored during session    Home Living Family/patient expects to be discharged to:: Private residence Living Arrangements: Spouse/significant other   Type of Home: House Home Access: Stairs to enter Entrance Stairs-Rails: Right Entrance Stairs-Number of Steps: 2 Home Layout: One level Home Equipment: Environmental consultant - 2 wheels      Prior Function Level of Independence: Independent               Hand Dominance        Extremity/Trunk Assessment   Upper Extremity Assessment Upper Extremity Assessment: Overall WFL for tasks assessed    Lower Extremity Assessment Lower Extremity Assessment: LLE deficits/detail LLE Deficits / Details: good quad contraction, ROM TBA       Communication   Communication: No difficulties  Cognition Arousal/Alertness: Awake/alert Behavior During Therapy: WFL for tasks assessed/performed Overall Cognitive Status: Within Functional Limits for tasks assessed                                        General Comments      Exercises     Assessment/Plan    PT Assessment Patient needs continued PT services  PT Problem List Decreased strength;Decreased range of motion;Decreased mobility;Decreased knowledge of use of DME;Pain;Decreased knowledge of precautions  PT Treatment Interventions Functional mobility training;Stair training;Therapeutic exercise;Gait training;Therapeutic activities;DME instruction;Patient/family education    PT Goals (Current goals can be found in the Care Plan section)  Acute Rehab PT Goals Patient Stated Goal: home PT Goal Formulation: With patient Time For Goal Achievement: 07/26/16 Potential to Achieve Goals: Good    Frequency 7X/week   Barriers to discharge        Co-evaluation               End of Session Equipment Utilized During Treatment: Gait belt Activity Tolerance: Patient tolerated treatment  well Patient left: Other (comment) (in bathroom, will use pull cord when finished, aware not to get up alone)   PT Visit Diagnosis: Other abnormalities of gait and mobility (R26.89)    Time: 1610-9604 PT Time Calculation (min) (ACUTE ONLY): 20 min   Charges:   PT Evaluation $PT Eval Low Complexity: 1 Procedure     PT G CodesZenovia Jarred, PT, DPT 07/22/2016 Pager: [phone]   Maida Sale E 07/22/2016, 11:46 AM

## 2016-07-22 NOTE — Progress Notes (Signed)
   Subjective: 1 Day Post-Op Procedure(s) (LRB): LEFT TOTAL KNEE ARTHROPLASTY (Left) Patient reports pain as mild and moderate.   Patient seen in rounds for Dr. Lequita Halt.  Had some nausea earlier. Soft pressures eariler today also treated with fluids. Patient is well, but has had some minor complaints of pain in the knee, requiring pain medications Started therapy and was able to ambulate this afternoon.  Plan is to go Home after hospital stay.  Objective: Vital signs in last 24 hours: Temp:  [97.9 F (36.6 C)-98.6 F (37 C)] 98.6 F (37 C) (04/17 2033) Pulse Rate:  [62-77] 77 (04/17 2033) Resp:  [16-18] 18 (04/17 2033) BP: (95-128)/(47-75) 124/53 (04/17 2033) SpO2:  [97 %-100 %] 100 % (04/17 2033)  Intake/Output from previous day:  Intake/Output Summary (Last 24 hours) at 07/22/16 2140 Last data filed at 07/22/16 2033  Gross per 24 hour  Intake             2340 ml  Output             3325 ml  Net             -985 ml    Intake/Output this shift: Total I/O In: 240 [P.O.:240] Out: 425 [Urine:425]  Labs:  Recent Labs  07/22/16 0521  HGB 10.9*    Recent Labs  07/22/16 0521  WBC 8.3  RBC 3.42*  HCT 32.6*  PLT 142*    Recent Labs  07/22/16 0521  NA 136  K 4.2  CL 105  CO2 25  BUN 15  CREATININE 0.66  GLUCOSE 98  CALCIUM 8.5*   No results for input(s): LABPT, INR in the last 72 hours.  EXAM General - Patient is Alert, Appropriate and Oriented Extremity - Neurovascular intact Sensation intact distally Intact pulses distally Dorsiflexion/Plantar flexion intact Dressing - dressing C/D/I Motor Function - intact, moving foot and toes well on exam.  Hemovac came out last night  Past Medical History:  Diagnosis Date  . Anxiety    no treatment  . Arthritis    Rheaumatoid markers and general  . Headache    rare migraines  . Raynaud's disease    mild    Assessment/Plan: 1 Day Post-Op Procedure(s) (LRB): LEFT TOTAL KNEE ARTHROPLASTY  (Left) Principal Problem:   OA (osteoarthritis) of knee  Estimated body mass index is 20.36 kg/m as calculated from the following:   Height as of this encounter:  (1.702 m).   Weight as of this encounter: 59 kg (130 lb). Advance diet Up with therapy Plan for discharge tomorrow  DVT Prophylaxis - Xarelto Weight-Bearing as tolerated to left leg D/C O2 and Pulse OX and try on Room Air HGB 10.9  Avel Peace, PA-C Orthopaedic Surgery 07/22/2016, 9:40 PM

## 2016-07-22 NOTE — Discharge Instructions (Addendum)
° °Dr. Frank Aluisio °Total Joint Specialist °[facility] °[address] °[place], [place] [address] °[phone] ° °TOTAL KNEE REPLACEMENT POSTOPERATIVE DIRECTIONS ° °Knee Rehabilitation, Guidelines Following Surgery  °Results after knee surgery are often greatly improved when you follow the exercise, range of motion and muscle strengthening exercises prescribed by your doctor. Safety measures are also important to protect the knee from further injury. Any time any of these exercises cause you to have increased pain or swelling in your knee joint, decrease the amount until you are comfortable again and slowly increase them. If you have problems or questions, call your caregiver or physical therapist for advice.  ° °HOME CARE INSTRUCTIONS  °Remove items at home which could result in a fall. This includes throw rugs or furniture in walking pathways.  °· ICE to the affected knee every three hours for 30 minutes at a time and then as needed for pain and swelling.  Continue to use ice on the knee for pain and swelling from surgery. You may notice swelling that will progress down to the foot and ankle.  This is normal after surgery.  Elevate the leg when you are not up walking on it.   °· Continue to use the breathing machine which will help keep your temperature down.  It is common for your temperature to cycle up and down following surgery, especially at night when you are not up moving around and exerting yourself.  The breathing machine keeps your lungs expanded and your temperature down. °· Do not place pillow under knee, focus on keeping the knee straight while resting ° °DIET °You may resume your previous home diet once your are discharged from the hospital. ° °DRESSING / WOUND CARE / SHOWERING °You may shower 3 days after surgery, but keep the wounds dry during showering.  You may use an occlusive plastic wrap (Press'n Seal for example), NO SOAKING/SUBMERGING IN THE BATHTUB.  If the  bandage gets wet, change with a clean dry gauze.  If the incision gets wet, pat the wound dry with a clean towel. °You may start showering once you are discharged home but do not submerge the incision under water. Just pat the incision dry and apply a dry gauze dressing on daily. °Change the surgical dressing daily and reapply a dry dressing each time. ° °ACTIVITY °Walk with your walker as instructed. °Use walker as long as suggested by your caregivers. °Avoid periods of inactivity such as sitting longer than an hour when not asleep. This helps prevent blood clots.  °You may resume a sexual relationship in one month or when given the OK by your doctor.  °You may return to work once you are cleared by your doctor.  °Do not drive a car for 6 weeks or until released by you surgeon.  °Do not drive while taking narcotics. ° °WEIGHT BEARING °Weight bearing as tolerated with assist device (walker, cane, etc) as directed, use it as long as suggested by your surgeon or therapist, typically at least 4-6 weeks. ° °POSTOPERATIVE CONSTIPATION PROTOCOL °Constipation - defined medically as fewer than three stools per week and severe constipation as less than one stool per week. ° °One of the most common issues patients have following surgery is constipation.  Even if you have a regular bowel pattern at home, your normal regimen is likely to be disrupted due to multiple reasons following surgery.  Combination of anesthesia, postoperative narcotics, change in appetite and fluid intake all can affect your bowels.    In order to avoid complications following surgery, here are some recommendations in order to help you during your recovery period. ° °Colace (docusate) - Pick up an over-the-counter form of Colace or another stool softener and take twice a day as long as you are requiring postoperative pain medications.  Take with a full glass of water daily.  If you experience loose stools or diarrhea, hold the colace until you stool forms  back up.  If your symptoms do not get better within 1 week or if they get worse, check with your doctor. ° °Dulcolax (bisacodyl) - Pick up over-the-counter and take as directed by the product packaging as needed to assist with the movement of your bowels.  Take with a full glass of water.  Use this product as needed if not relieved by Colace only.  ° °MiraLax (polyethylene glycol) - Pick up over-the-counter to have on hand.  MiraLax is a solution that will increase the amount of water in your bowels to assist with bowel movements.  Take as directed and can mix with a glass of water, juice, soda, coffee, or tea.  Take if you go more than two days without a movement. °Do not use MiraLax more than once per day. Call your doctor if you are still constipated or irregular after using this medication for 7 days in a row. ° °If you continue to have problems with postoperative constipation, please contact the office for further assistance and recommendations.  If you experience "the worst abdominal pain ever" or develop nausea or vomiting, please contact the office immediatly for further recommendations for treatment. ° °ITCHING ° If you experience itching with your medications, try taking only a single pain pill, or even half a pain pill at a time.  You can also use Benadryl over the counter for itching or also to help with sleep.  ° °TED HOSE STOCKINGS °Wear the elastic stockings on both legs for three weeks following surgery during the day but you may remove then at night for sleeping. ° °MEDICATIONS °See your medication summary on the “After Visit Summary” that the nursing staff will review with you prior to discharge.  You may have some home medications which will be placed on hold until you complete the course of blood thinner medication.  It is important for you to complete the blood thinner medication as prescribed by your surgeon.  Continue your approved medications as instructed at time of  discharge. ° °PRECAUTIONS °If you experience chest pain or shortness of breath - call 911 immediately for transfer to the hospital emergency department.  °If you develop a fever greater that 101 F, purulent drainage from wound, increased redness or drainage from wound, foul odor from the wound/dressing, or calf pain - CONTACT YOUR SURGEON.   °                                                °FOLLOW-UP APPOINTMENTS °Make sure you keep all of your appointments after your operation with your surgeon and caregivers. You should call the office at the above phone number and make an appointment for approximately two weeks after the date of your surgery or on the date instructed by your surgeon outlined in the "After Visit Summary". ° ° °RANGE OF MOTION AND STRENGTHENING EXERCISES  °Rehabilitation of the knee is important following a knee injury or   an operation. After just a few days of immobilization, the muscles of the thigh which control the knee become weakened and shrink (atrophy). Knee exercises are designed to build up the tone and strength of the thigh muscles and to improve knee motion. Often times heat used for twenty to thirty minutes before working out will loosen up your tissues and help with improving the range of motion but do not use heat for the first two weeks following surgery. These exercises can be done on a training (exercise) mat, on the floor, on a table or on a bed. Use what ever works the best and is most comfortable for you Knee exercises include:  °Leg Lifts - While your knee is still immobilized in a splint or cast, you can do straight leg raises. Lift the leg to 60 degrees, hold for 3 sec, and slowly lower the leg. Repeat 10-20 times 2-3 times daily. Perform this exercise against resistance later as your knee gets better.  °Quad and Hamstring Sets - Tighten up the muscle on the front of the thigh (Quad) and hold for 5-10 sec. Repeat this 10-20 times hourly. Hamstring sets are done by pushing the  foot backward against an object and holding for 5-10 sec. Repeat as with quad sets.  °· Leg Slides: Lying on your back, slowly slide your foot toward your buttocks, bending your knee up off the floor (only go as far as is comfortable). Then slowly slide your foot back down until your leg is flat on the floor again. °· Angel Wings: Lying on your back spread your legs to the side as far apart as you can without causing discomfort.  °A rehabilitation program following serious knee injuries can speed recovery and prevent re-injury in the future due to weakened muscles. Contact your doctor or a physical therapist for more information on knee rehabilitation.  ° °IF YOU ARE TRANSFERRED TO A SKILLED REHAB FACILITY °If the patient is transferred to a skilled rehab facility following release from the hospital, a list of the current medications will be sent to the facility for the patient to continue.  When discharged from the skilled rehab facility, please have the facility set up the patient's Home Health Physical Therapy prior to being released. Also, the skilled facility will be responsible for providing the patient with their medications at time of release from the facility to include their pain medication, the muscle relaxants, and their blood thinner medication. If the patient is still at the rehab facility at time of the two week follow up appointment, the skilled rehab facility will also need to assist the patient in arranging follow up appointment in our office and any transportation needs. ° °MAKE SURE YOU:  °Understand these instructions.  °Get help right away if you are not doing well or get worse.  ° ° °Pick up stool softner and laxative for home use following surgery while on pain medications. °Do not submerge incision under water. °Please use good hand washing techniques while changing dressing each day. °May shower starting three days after surgery. °Please use a clean towel to pat the incision dry following  showers. °Continue to use ice for pain and swelling after surgery. °Do not use any lotions or creams on the incision until instructed by your surgeon. ° °Take Xarelto for two and a half more weeks following discharge from the hospital, then discontinue Xarelto. °Once the patient has completed the blood thinner regimen, then take a Baby 81 mg Aspirin daily for three   more weeks.    Information on my medicine - XARELTO (Rivaroxaban)  This medication education was reviewed with me or my healthcare representative as part of my discharge preparation.  The pharmacist that spoke with me during my hospital stay was:  Otho Bellows, [facility]  Why was Xarelto prescribed for you? Xarelto was prescribed for you to reduce the risk of blood clots forming after orthopedic surgery. The medical term for these abnormal blood clots is venous thromboembolism (VTE).  What do you need to know about xarelto ? Take your Xarelto ONCE DAILY at the same time every day. You may take it either with or without food.  If you have difficulty swallowing the tablet whole, you may crush it and mix in applesauce just prior to taking your dose.  Take Xarelto exactly as prescribed by your doctor and DO NOT stop taking Xarelto without talking to the doctor who prescribed the medication.  Stopping without other VTE prevention medication to take the place of Xarelto may increase your risk of developing a clot.  After discharge, you should have regular check-up appointments with your healthcare provider that is prescribing your Xarelto.    What do you do if you miss a dose? If you miss a dose, take it as soon as you remember on the same day then continue your regularly scheduled once daily regimen the next day. Do not take two doses of Xarelto on the same day.   Important Safety Information A possible side effect of Xarelto is bleeding. You should call your healthcare provider right away if you experience any of the  following: ? Bleeding from an injury or your nose that does not stop. ? Unusual colored urine (red or dark brown) or unusual colored stools (red or black). ? Unusual bruising for unknown reasons. ? A serious fall or if you hit your head (even if there is no bleeding).  Some medicines may interact with Xarelto and might increase your risk of bleeding while on Xarelto. To help avoid this, consult your healthcare provider or pharmacist prior to using any new prescription or non-prescription medications, including herbals, vitamins, non-steroidal anti-inflammatory drugs (NSAIDs) and supplements.  This website has more information on Xarelto: VisitDestination.com.br.

## 2016-07-23 LAB — CBC
HCT: 31.2 % — ABNORMAL LOW (ref 36.0–46.0)
Hemoglobin: 10.4 g/dL — ABNORMAL LOW (ref 12.0–15.0)
MCH: 31.4 pg (ref 26.0–34.0)
MCHC: 33.3 g/dL (ref 30.0–36.0)
MCV: 94.3 fL (ref 78.0–100.0)
PLATELETS: 133 10*3/uL — AB (ref 150–400)
RBC: 3.31 MIL/uL — ABNORMAL LOW (ref 3.87–5.11)
RDW: 13 % (ref 11.5–15.5)
WBC: 7.8 10*3/uL (ref 4.0–10.5)

## 2016-07-23 LAB — BASIC METABOLIC PANEL
ANION GAP: 5 (ref 5–15)
BUN: 14 mg/dL (ref 6–20)
CO2: 25 mmol/L (ref 22–32)
Calcium: 8.6 mg/dL — ABNORMAL LOW (ref 8.9–10.3)
Chloride: 102 mmol/L (ref 101–111)
Creatinine, Ser: 0.65 mg/dL (ref 0.44–1.00)
GFR calc Af Amer: >60 mL/min (ref >60)
Glucose, Bld: 108 mg/dL — ABNORMAL HIGH (ref 65–99)
POTASSIUM: 4.1 mmol/L (ref 3.5–5.1)
SODIUM: 132 mmol/L — AB (ref 135–145)

## 2016-07-23 NOTE — Progress Notes (Signed)
Physical Therapy Treatment Patient Details Name: Jodi Lopez MRN: [id number] DOB: Aug 29, 1940 Today's Date: 07/23/2016    History of Present Illness Pt is a 76 year old female s/p L TKA, h/o anxiety and obsessive/compulsive    PT Comments    Pt ambulated in hallway and performed heel slides sitting EOB.  Answered all pt and family questions within scope (mostly about KI, ice and exercises).  Pt ready for d/c home today.   Follow Up Recommendations  Outpatient PT     Equipment Recommendations  None recommended by PT    Recommendations for Other Services       Precautions / Restrictions Precautions Precautions: Knee;Fall Restrictions Other Position/Activity Restrictions: WBAT    Mobility  Bed Mobility Overal bed mobility: Needs Assistance Bed Mobility: Supine to Sit     Supine to sit: Supervision Sit to supine: Min assist   General bed mobility comments: verbal cues for technique, encouragement for pt to attempt on her own  Transfers Overall transfer level: Needs assistance Equipment used: Rolling walker (2 wheeled) Transfers: Sit to/from Stand Sit to Stand: Min guard         General transfer comment: verbal cues for UE and LE positioning  Ambulation/Gait Ambulation/Gait assistance: Min guard Ambulation Distance (Feet): 80 Feet Assistive device: Rolling walker (2 wheeled) Gait Pattern/deviations: Step-to pattern;Decreased stance time - left;Antalgic Gait velocity: decr   General Gait Details: verbal cues for sequence, RW positioning, heel strike, posture, step length, also practiced side stepping to left and right about 5 feet each side per spouse request (for bathroom)   Stairs Stairs: Yes   Stair Management: Step to pattern;Backwards;With walker Number of Stairs: 2 General stair comments: verbal cues for sequence, RW positioning and safety, spouse assisted with holding RW, pt tolerated and performed well - no physical assist  Wheelchair  Mobility    Modified Rankin (Stroke Patients Only)       Balance                                            Cognition Arousal/Alertness: Awake/alert Behavior During Therapy: Anxious Overall Cognitive Status: Within Functional Limits for tasks assessed                                        Exercises Total Joint Exercises Heel Slides: AAROM;Left;10 reps;AROM;Seated    General Comments        Pertinent Vitals/Pain Pain Assessment: 0-10 Pain Score: 4  Pain Location: L knee Pain Descriptors / Indicators: Sore;Aching Pain Intervention(s): Limited activity within patient's tolerance;Monitored during session;Repositioned;Ice applied    Home Living                      Prior Function            PT Goals (current goals can now be found in the care plan section) Progress towards PT goals: Progressing toward goals    Frequency    7X/week      PT Plan Current plan remains appropriate    Co-evaluation             End of Session Equipment Utilized During Treatment: Gait belt Activity Tolerance: Patient tolerated treatment well Patient left: in bed;with call bell/phone within reach;with family/visitor present Nurse Communication: Mobility status  PT Visit Diagnosis: Other abnormalities of gait and mobility (R26.89)     Time: 1343-1410 PT Time Calculation (min) (ACUTE ONLY): 27 min  Charges:  $Gait Training: 8-22 mins $Therapeutic Activity: 8-22 mins                    G Codes:       Zenovia Jarred, PT, DPT 07/23/2016 Pager: [phone]   Maida Sale E 07/23/2016, 4:03 PM

## 2016-07-23 NOTE — Progress Notes (Signed)
Physical Therapy Treatment Patient Details Name: Jodi Lopez MRN: [id number] DOB: October 29, 1940 Today's Date: 07/23/2016    History of Present Illness Pt is a 76 year old female s/p L TKA, h/o anxiety and obsessive/compulsive    PT Comments    Pt reports being very tired and having knee pain however encouraged from therapist and spouse that pt needed to mobilize today.  Pt ambulated short distance and practiced safe stair technique with spouse present then requested back to bed.   Follow Up Recommendations  Outpatient PT     Equipment Recommendations  None recommended by PT    Recommendations for Other Services       Precautions / Restrictions Precautions Precautions: Knee;Fall Restrictions Other Position/Activity Restrictions: WBAT    Mobility  Bed Mobility Overal bed mobility: Needs Assistance Bed Mobility: Sit to Supine       Sit to supine: Min assist   General bed mobility comments: encouraged pt to self assist which she intiated and then requested assist for L LE from spouse  Transfers Overall transfer level: Needs assistance Equipment used: Rolling walker (2 wheeled) Transfers: Sit to/from Stand Sit to Stand: Min guard         General transfer comment: verbal cues for UE and LE positioning  Ambulation/Gait Ambulation/Gait assistance: Min guard Ambulation Distance (Feet): 40 Feet Assistive device: Rolling walker (2 wheeled) Gait Pattern/deviations: Step-to pattern;Decreased stance time - left;Antalgic Gait velocity: decr   General Gait Details: verbal cues for sequence, RW positioning, heel strike, posture, step length   Stairs Stairs: Yes   Stair Management: Step to pattern;Backwards;With walker Number of Stairs: 2 General stair comments: verbal cues for sequence, RW positioning and safety, spouse assisted with holding RW, pt tolerated and performed well - no physical assist  Wheelchair Mobility    Modified Rankin (Stroke Patients  Only)       Balance                                            Cognition Arousal/Alertness: Awake/alert Behavior During Therapy: Anxious Overall Cognitive Status: Within Functional Limits for tasks assessed                                        Exercises      General Comments        Pertinent Vitals/Pain Pain Assessment: 0-10 Pain Score: 5  Pain Location: L knee Pain Descriptors / Indicators: Sore;Aching Pain Intervention(s): Limited activity within patient's tolerance;Monitored during session;Repositioned;Premedicated before session    Home Living                      Prior Function            PT Goals (current goals can now be found in the care plan section) Progress towards PT goals: Progressing toward goals    Frequency    7X/week      PT Plan Current plan remains appropriate    Co-evaluation             End of Session Equipment Utilized During Treatment: Gait belt Activity Tolerance: Patient tolerated treatment well Patient left: in bed;with call bell/phone within reach;with family/visitor present   PT Visit Diagnosis: Other abnormalities of gait and mobility (R26.89)  Time: 1030-1053 PT Time Calculation (min) (ACUTE ONLY): 23 min  Charges:  $Gait Training: 23-37 mins                    G Codes:       Zenovia Jarred, PT, DPT 07/23/2016 Pager: [phone]    Maida Sale E 07/23/2016, 12:36 PM

## 2016-07-23 NOTE — Progress Notes (Signed)
Occupational Therapy Treatment Patient Details Name: Jodi Lopez MRN: [id number] DOB: 1941-03-09 Today's Date: 07/23/2016    History of present illness Pt is a 76 year old female s/p L TKA, h/o anxiety and obsessive/compulsive   OT comments  Tolerated session well.  Used KI as pt could not lift LLE this am.    Follow Up Recommendations  No OT follow up;Supervision/Assistance - 24 hour    Equipment Recommendations  3 in 1 bedside commode    Recommendations for Other Services      Precautions / Restrictions Precautions Precautions: Knee;Fall Restrictions Other Position/Activity Restrictions: WBAT       Mobility Bed Mobility           Sit to supine: Min assist   General bed mobility comments: assist for L LE  Transfers   Equipment used: Rolling walker (2 wheeled)   Sit to Stand: Min guard         General transfer comment: verbal cues for UE and LE positioning    Balance                                           ADL either performed or assessed with clinical judgement   ADL       Grooming: Oral care;Supervision/safety;Standing                   Toilet Transfer: Min guard;Ambulation;BSC;RW   Toileting- Clothing Manipulation and Hygiene: Supervision/safety;Sitting/lateral lean         General ADL Comments: ambulated to bathroom and used commode.  Min A for bed mobility today; used KI and re-educated on this.       Vision       Perception     Praxis      Cognition Arousal/Alertness: Awake/alert Behavior During Therapy: WFL for tasks assessed/performed Overall Cognitive Status: Within Functional Limits for tasks assessed                                          Exercises     Shoulder Instructions       General Comments      Pertinent Vitals/ Pain       Pain Score: 4  Pain Location: L knee Pain Descriptors / Indicators: Sore;Aching Pain Intervention(s): Limited activity within  patient's tolerance;Monitored during session;Premedicated before session;Repositioned;Ice applied  Home Living                                          Prior Functioning/Environment              Frequency           Progress Toward Goals  OT Goals(current goals can now be found in the care plan section)  Progress towards OT goals: Progressing toward goals     Plan      Co-evaluation                 End of Session    OT Visit Diagnosis: Pain Pain - Right/Left: Left Pain - part of body: Knee   Activity Tolerance Patient tolerated treatment well   Patient Left in chair;with call bell/phone within reach;with chair alarm  set   Nurse Communication          Time: [phone] OT Time Calculation (min): 28 min  Charges: OT General Charges $OT Visit: 1 Procedure OT Treatments $Self Care/Home Management : 23-37 mins  Jodi Lopez, OTR/L [phone] 07/23/2016   Farah Lepak 07/23/2016, 10:06 AM

## 2016-09-05 NOTE — Addendum Note (Signed)
Addendum  created 09/05/16 1252 by Shelton SilvasHollis, Kevin D, MD   Sign clinical note

## 2019-06-09 ENCOUNTER — Ambulatory Visit: Payer: Medicare Other
